# Patient Record
Sex: Male | Born: 1955 | Race: White | Hispanic: No | Smoking: Never smoker
Health system: Southern US, Community
[De-identification: ages and names within clinical notes are randomized; demographics above are authoritative.]

## PROBLEM LIST (undated history)

## (undated) DIAGNOSIS — I350 Nonrheumatic aortic (valve) stenosis: Secondary | ICD-10-CM

## (undated) DIAGNOSIS — I10 Essential (primary) hypertension: Secondary | ICD-10-CM

## (undated) DIAGNOSIS — I341 Nonrheumatic mitral (valve) prolapse: Principal | ICD-10-CM

## (undated) HISTORY — DX: Nonrheumatic mitral (valve) prolapse: I34.1

## (undated) HISTORY — DX: Nonrheumatic aortic (valve) stenosis: I35.0

## (undated) HISTORY — DX: Essential (primary) hypertension: I10

---

## 1976-08-20 HISTORY — PX: ORIF HUMERUS FRACTURE: SHX2126

## 1994-08-20 HISTORY — PX: ARTHROSCOPIC REPAIR ACL: SUR80

## 2000-02-01 ENCOUNTER — Emergency Department (HOSPITAL_COMMUNITY): Admission: EM | Admit: 2000-02-01 | Discharge: 2000-02-01 | Payer: Self-pay | Admitting: Emergency Medicine

## 2000-02-01 ENCOUNTER — Encounter: Payer: Self-pay | Admitting: Emergency Medicine

## 2000-02-01 ENCOUNTER — Encounter: Payer: Self-pay | Admitting: General Surgery

## 2003-05-27 ENCOUNTER — Encounter: Payer: Self-pay | Admitting: Gastroenterology

## 2003-05-27 ENCOUNTER — Ambulatory Visit (HOSPITAL_COMMUNITY): Admission: RE | Admit: 2003-05-27 | Discharge: 2003-05-27 | Payer: Self-pay | Admitting: Gastroenterology

## 2004-12-28 ENCOUNTER — Ambulatory Visit: Payer: Self-pay | Admitting: Family Medicine

## 2005-01-17 ENCOUNTER — Ambulatory Visit: Payer: Self-pay | Admitting: Family Medicine

## 2007-05-02 DIAGNOSIS — I10 Essential (primary) hypertension: Secondary | ICD-10-CM | POA: Insufficient documentation

## 2007-11-18 ENCOUNTER — Ambulatory Visit: Payer: Self-pay | Admitting: Family Medicine

## 2007-11-18 DIAGNOSIS — M5380 Other specified dorsopathies, site unspecified: Secondary | ICD-10-CM | POA: Insufficient documentation

## 2008-03-18 ENCOUNTER — Telehealth: Payer: Self-pay | Admitting: Family Medicine

## 2008-12-27 ENCOUNTER — Telehealth: Payer: Self-pay | Admitting: Family Medicine

## 2008-12-29 ENCOUNTER — Ambulatory Visit: Payer: Self-pay | Admitting: Family Medicine

## 2009-01-19 ENCOUNTER — Telehealth: Payer: Self-pay | Admitting: Family Medicine

## 2009-02-17 ENCOUNTER — Ambulatory Visit: Payer: Self-pay | Admitting: Family Medicine

## 2009-02-18 ENCOUNTER — Telehealth: Payer: Self-pay | Admitting: Family Medicine

## 2009-09-27 ENCOUNTER — Ambulatory Visit: Payer: Self-pay | Admitting: Family Medicine

## 2010-01-11 ENCOUNTER — Ambulatory Visit: Payer: Self-pay | Admitting: Family Medicine

## 2010-01-11 DIAGNOSIS — M549 Dorsalgia, unspecified: Secondary | ICD-10-CM | POA: Insufficient documentation

## 2010-01-11 DIAGNOSIS — H8309 Labyrinthitis, unspecified ear: Secondary | ICD-10-CM

## 2010-01-11 HISTORY — DX: Labyrinthitis, unspecified ear: H83.09

## 2010-08-07 ENCOUNTER — Telehealth: Payer: Self-pay | Admitting: Family Medicine

## 2010-08-17 ENCOUNTER — Ambulatory Visit: Payer: Self-pay | Admitting: Family Medicine

## 2010-08-17 DIAGNOSIS — F430 Acute stress reaction: Secondary | ICD-10-CM | POA: Insufficient documentation

## 2010-08-22 ENCOUNTER — Telehealth: Payer: Self-pay | Admitting: Family Medicine

## 2010-09-19 NOTE — Assessment & Plan Note (Signed)
Summary: spider bite/back pain//ccm   Vital Signs:  Patient profile:   55 year old male O2 Sat:      95 % Temp:     98.4 degrees F Pulse rate:   84 / minute BP sitting:   140 / 94  (left arm)  Vitals Entered By: Pura Spice, RN (Jan 11, 2010 2:35 PM) CC: killed  black spider  c/o rt eye puffy and ongoing back pain  he stated he takes 56 advil a day day    History of Present Illness: repeat BP 132/84 This itch 55-year-old white married male is in for evaluation and treatment of an insect bite on his face causing marked swelling and conjunctival erythema painful Hypertension has been controlled with Micardis 80-12,5 Continues to have problem with chronic back pain and takes Advil as needed. Diclofenac did not help. 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 Cleocin (Clindamycin Hcl)  Past History:  Risk Factors: Smoking Status: never (09/27/2009)  Past Medical History: Hypertension  chronic back pain  Review of Systems      See HPI  The patient denies anorexia, fever, weight loss, weight gain, vision loss, decreased hearing, hoarseness, chest pain, syncope, dyspnea on exertion, peripheral edema, prolonged cough, headaches, hemoptysis, abdominal pain, melena, hematochezia, severe indigestion/heartburn, hematuria, incontinence, genital sores, muscle weakness, suspicious skin lesions, transient blindness, difficulty walking, depression, unusual weight change, abnormal bleeding, enlarged lymph nodes, angioedema, breast masses, and testicular masses.    Physical Exam  General:  Well-developed,well-nourished,in no acute distress; alert,appropriate  and cooperative throughout examination Head:  Normocephalic and atraumatic without obvious abnormalities. No apparent alopecia or balding. Eyes:  periorbital region around right eye is swollen and erythematous and very tender to touch obvious site of insect bite Ears:  External ear exam shows no significant lesions or deformities.  Otoscopic examination reveals clear canals, tympanic membranes are intact bilaterally without bulging, retraction, inflammation or discharge. Hearing is grossly normal bilaterally. Nose:  External nasal examination shows no deformity or inflammation. Nasal mucosa are pink and moist without lesions or exudates. Mouth:  Oral mucosa and oropharynx without lesions or exudates.  Teeth in good repair. Neck:  No deformities, masses, or tenderness noted. Lungs:  Normal respiratory effort, chest expands symmetrically. Lungs are clear to auscultation, no crackles or wheezes. Heart:  Normal rate and regular rhythm. S1 and S2 normal without gallop, murmur, click, rub or other extra sounds. Msk:  muscle spasm L. lateral lumbar spine tenderness of lumbosacral spine   Impression & Recommendations:  Problem # 1:  LABYRINTHITIS, CHRONIC (ICD-386.30) Assessment New meclizine 25 mg t.i.d.  Problem # 2:  BACK PAIN, CHRONIC (ICD-724.5) Assessment: Unchanged  His updated medication list for this problem includes:    Advil 200 Mg Tabs (Ibuprofen) .Marland Kitchen... As needed    Aspirin 81 Mg Tabs (Aspirin) ..... Once daily    Flexeril 10 Mg Tabs (Cyclobenzaprine hcl) .Marland Kitchen... 1 morn midafternoon and bedtime for muscle spasm    Tylox 5-500 Mg Caps (Oxycodone-acetaminophen) .Marland Kitchen... 1 4-6 hh as needed pain, not over 4 per day    Diclofenac Sodium 75 Mg Tbec (Diclofenac sodium) .Marland Kitchen... 1  two times a day pc for inflammation  Tylox 5-500 Mg Caps (Oxycodone-acetaminophen) .Marland Kitchen... 1 q4-6 h as needed pain not over 4 per day, fill on june 25,2011    Tylox 5-500 Mg Caps (Oxycodone-acetaminophen) .Marland Kitchen... 1 q4h as  needed pain not  over 4 per day, fill july 25 the [place]  Problem # 3:  INSECT BITE (ICD-919.4) Assessment: New prednisone p.o. plus TobraDex to treat eye  Problem # 4:  CONJUNCTIVITIS, ACUTE (ICD-372.00) Assessment: New TobraDex t.i.d.  Problem # 5:  BACK PAIN, CHRONIC (ICD-724.5) Assessment: Deteriorated Depo-Medrol plus Flexeril plus Tylox for pain  Problem # 6:  HYPERTENSION (ICD-401.9) Assessment: Improved  His updated medication list for this problem includes:    Micardis Hct 80-12.5 Mg Tabs (Telmisartan-hctz) .Marland Kitchen... 1 once daily for blood pressure  Complete Medication List: 1)  Advil 200 Mg Tabs (Ibuprofen) .... As needed 2)  Micardis Hct 80-12.5 Mg Tabs (Telmisartan-hctz) .Marland Kitchen.. 1 once daily for blood pressure 3)  Aspirin 81 Mg Tabs (Aspirin) .... Once daily 4)  Flexeril 10 Mg Tabs (Cyclobenzaprine hcl) .Marland Kitchen.. 1 morn midafternoon and bedtime for muscle spasm 5)  Tylox 5-500 Mg Caps (Oxycodone-acetaminophen) .Marland Kitchen.. 1 4-6 hh as needed pain, not over 4 per day 6)  Diclofenac Sodium 75 Mg Tbec (Diclofenac sodium) .Marland Kitchen.. 1  two times a day pc for inflammation 7)  Tylox 5-500 Mg Caps (Oxycodone-acetaminophen) .Marland Kitchen.. 1 q4-6 h as needed pain not over 4 per day, fill on june 25,2011 8)  Tylox 5-500 Mg Caps (Oxycodone-acetaminophen) .Marland Kitchen.. 1 q4h as needed pain not  over 4 per day, fill july 25 9)  Meclizine Hcl 25 Mg Tabs (Meclizine hcl) .Marland Kitchen.. 1 morn, midaternoon and hs for dizziness and nausea 10)  Prednisone 20 Mg Tabs (Prednisone) .Marland Kitchen.. 1 qidpc for 2 days, 1tid for5 days then 1bid for 6 days then 1 once daily for spider bite and back inflamationm 11)  Tobradex St 0.3-0.05 % Susp (Tobramycin-dexamethasone) .Marland Kitchen.. 1 qtt in infected eye three times a day,also 1 qt at hs in not infected eye  Patient Instructions: 1)  Unable to determine type of insect bite on the face causing a reaction around and below the right eye as well as beginning conjunctivitis and was very  toxic to you 2)  Hypertension is controlled 3)  Will treat labyrinthitis with meclizine 4)  Continue to treat the chronic back pain with Advil 800 mg t.i.d. as well as Tylox for pain 5)  Call when you're coming back to town and we will schedule an MRI for your back Prescriptions: TOBRADEX ST 0.3-0.05 % SUSP (TOBRAMYCIN-DEXAMETHASONE) 1 qtt in infected eye three times a day,also 1 qt at hs in not infected eye  #5.0cc x 0   Entered and Authorized by:   Judithann Sheen MD   Signed by:   Judithann Sheen MD on 01/11/2010   Method used:   Print then Give to Patient   RxID:   [id number] PREDNISONE 20 MG TABS (PREDNISONE) 1 qidpc for 2 days, 1tid for5 days then 1bid for 6 days then 1 once daily for spider bite and back inflamationm  #44 x 0   Entered and Authorized by:   Judithann Sheen MD   Signed by:   Judithann Sheen MD on 01/11/2010   Method used:   Print then Give to Patient   RxID:   [id number] MECLIZINE HCL 25 MG TABS (MECLIZINE HCL) 1 morn, midaternoon and hs for dizziness and nausea  #90 x  5   Entered and Authorized by:   Judithann Sheen MD   Signed by:   Judithann Sheen MD on 01/11/2010   Method used:   Print then Give to Patient   RxID:   [id number] TYLOX 5-500 MG CAPS (OXYCODONE-ACETAMINOPHEN) 1 q4h as needed pain not  over 4 per day, fill July 25  #100 x 0   Entered and Authorized by:   Judithann Sheen MD   Signed by:   Judithann Sheen MD on 01/11/2010   Method used:   Print then Give to Patient   RxID:   [id number] TYLOX 5-500 MG CAPS (OXYCODONE-ACETAMINOPHEN) 1 q4-6 h as needed pain not over 4 per day, fill on June 25,2011  #100 x 0   Entered and Authorized by:   Judithann Sheen MD   Signed by:   Judithann Sheen MD on 01/11/2010   Method used:   Print then Give to Patient   RxID:   [id number] TYLOX 5-500 MG CAPS (OXYCODONE-ACETAMINOPHEN) 1 4-6 hh as needed pain, not over 4 per day  #100 x 0    Entered and Authorized by:   Judithann Sheen MD   Signed by:   Judithann Sheen MD on 01/11/2010   Method used:   Print then Give to Patient   RxID:   [id number]
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
## 2010-09-21 NOTE — Assessment & Plan Note (Signed)
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 This decision has caused considerable stress between he and his wife believes had considerable anxiety and some depression and is seeking some help her we discussed the pros and cons of this to see him Patient's blood pressure been under good control 120/86 her He continues to have back pain which is of a chronic nature and 8 take ibuprofen daily rather than diclofenac occasionally has to have Flexeril for muscle. He does need oxycodone at times when his pain is so bad He is in for refill of medications as well as a medication for anxiety depression and stress.  Allergies: 1)  ! Cleocin (Clindamycin Hcl)  Past History:  Past Medical History: Last updated: 01/11/2010 Hypertension  chronic back pain  Risk Factors: Smoking Status: never (09/27/2009)  Review of Systems      See HPI  Physical Exam  General:  Well-developed,well-nourished,in no acute distress; alert,appropriate and cooperative throughout examination Neck:  No deformities, masses, or tenderness noted. Chest Wall:  No deformities, masses, tenderness or gynecomastia noted. Lungs:  Normal respiratory effort, chest expands symmetrically. Lungs are clear to auscultation, no crackles or wheezes. Heart:  Normal rate and regular rhythm. S1 and S2 normal without gallop, murmur, click, rub or other extra sounds. Msk:   no positive findings on back exam except for mild lumbosacral muscles Pulses:  R and L carotid,radial,femoral,dorsalis pedis and posterior tibial pulses are full and equal bilaterally Extremities:  No clubbing, cyanosis, edema, or deformity noted with normal full range of motion of all joints.   Psych:  appearance somewhat anxious however he is  always hyper   Impression & Recommendations:  Problem # 1:  STRESS REACTION, ACUTE (ICD-308.9) Assessment New Lexapro 10 mg q.d.  Problem # 2:  LABYRINTHITIS, CHRONIC (ICD-386.30) Assessment: Improved hazy use meclizine 25 mg t.i.d. in the past  Problem # 3:  MUSCLE SPASM, LUMBAR REGION (ICD-724.8) Assessment: Improved  The following medications were removed from the medication list:    Diclofenac Sodium 75 Mg Tbec (Diclofenac sodium) .Marland Kitchen... 1  two times a day pc for inflammation His updated medication list for this problem includes:    Advil 200 Mg Tabs (Ibuprofen) .Marland Kitchen... As needed    Aspirin 81 Mg Tabs (Aspirin) ..... Once daily    Flexeril 10 Mg Tabs (Cyclobenzaprine hcl) .Marland Kitchen... 1 morn midafternoon and bedtime for muscle spasm    Tylox 5-500 Mg Caps (Oxycodone-acetaminophen) .Marland Kitchen... 1 4-6 hh as needed pain, not over 4 per day    Tylox 5-500 Mg Caps (Oxycodone-acetaminophen) .Marland Kitchen... 1 q4-6 h as needed pain not over 4 per day, fill on jan 29    Tylox 5-500 Mg Caps (Oxycodone-acetaminophen) .Marland Kitchen... 1 q4h as needed pain not  over 4 per  day, fill july 25  Problem # 4:  BACK PAIN, CHRONIC (ICD-724.5) Assessment: Improved  The following medications were removed from the medication list:    Diclofenac Sodium 75 Mg Tbec (Diclofenac sodium) .Marland Kitchen... 1  two times a day pc for inflammation His updated medication list for this problem includes:    Advil 200 Mg Tabs (Ibuprofen) .Marland Kitchen... As needed    Aspirin 81 Mg Tabs (Aspirin) ..... Once daily    Flexeril 10 Mg Tabs (Cyclobenzaprine hcl) .Marland Kitchen... 1 morn midafternoon and bedtime for muscle spasm    Tylox 5-500 Mg  Caps (Oxycodone-acetaminophen) .Marland Kitchen... 1 4-6 hh as needed pain, not over 4 per day    Tylox 5-500 Mg Caps (Oxycodone-acetaminophen) .Marland Kitchen... 1 q4-6 h as needed pain not over 4 per day, fill on jan 29    Tylox 5-500 Mg Caps (Oxycodone-acetaminophen) .Marland Kitchen... 1 q4h as needed pain not  over 4 per day, fill july 25  Problem # 5:  HYPERTENSION (ICD-401.9) Assessment: Improved  His updated medication list for this problem includes:    Micardis Hct 80-12.5 Mg Tabs (Telmisartan-hctz) .Marland Kitchen... 1 once daily for blood pressure  Complete Medication List: 1)  Advil 200 Mg Tabs (Ibuprofen) .... As needed 2)  Micardis Hct 80-12.5 Mg Tabs (Telmisartan-hctz) .Marland Kitchen.. 1 once daily for blood pressure 3)  Aspirin 81 Mg Tabs (Aspirin) .... Once daily 4)  Flexeril 10 Mg Tabs (Cyclobenzaprine hcl) .Marland Kitchen.. 1 morn midafternoon and bedtime for muscle spasm 5)  Tylox 5-500 Mg Caps (Oxycodone-acetaminophen) .Marland Kitchen.. 1 4-6 hh as needed pain, not over 4 per day 6)  Tylox 5-500 Mg Caps (Oxycodone-acetaminophen) .Marland Kitchen.. 1 q4-6 h as needed pain not over 4 per day, fill on jan 29 7)  Tylox 5-500 Mg Caps (Oxycodone-acetaminophen) .Marland Kitchen.. 1 q4h as needed pain not  over 4 per day, fill july 25 8)  Celexa 20 Mg Tabs (Citalopram hydrobromide) .Marland Kitchen.. 1 by mouth once daily  Patient Instructions: 1)  To continue medication as in the past for your hypertension and chronic low back pain. For this knee the anxiety or stress reaction and will prescribe Lexapro 10 mg q.d. 2)  Return in 4-6 weeks to evaluate result. Prescriptions: CELEXA 20 MG TABS (CITALOPRAM HYDROBROMIDE) 1 by mouth once daily  #30 x 6   Entered by:   Pura Spice, RN   Authorized by:   Judithann Sheen MD   Signed by:   Pura Spice, RN on 08/22/2010   Method used:   Electronically to        [facility] (retail)       [address]       [place], [place]  [address]       Ph: [phone]       Fax: [phone]   RxID:   [id number] TYLOX 5-500 MG  CAPS (OXYCODONE-ACETAMINOPHEN) 1 q4-6 h as needed pain not over 4 per day, fill on Jan 29  #120 x 0   Entered and Authorized by:   Judithann Sheen MD   Signed by:   Judithann Sheen MD on 08/17/2010   Method used:   Print then Give to Patient   RxID:   [id number] TYLOX 5-500 MG CAPS (OXYCODONE-ACETAMINOPHEN) 1 4-6 hh as needed pain, not over 4 per day  #120 x 0   Entered and Authorized by:   Judithann Sheen MD   Signed by:   Ivar Drape  Danice Goltz MD on 08/17/2010   Method used:   Print then Give to Patient   RxID:   [id number] MICARDIS HCT 80-12.5 MG TABS (TELMISARTAN-HCTZ) 1 once daily for blood pressure  #30 x 11   Entered and Authorized by:   Judithann Sheen MD   Signed by:   Judithann Sheen MD on 08/17/2010   Method used:   Electronically to        [facility] (retail)       [address]       [place], [place]  [address]       Ph: [phone]       Fax: [phone]   RxID:   [id number] LEXAPRO 10 MG TABS (ESCITALOPRAM OXALATE) 1 two times a day for anxiety and depression  #60 x 5   Entered and Authorized by:   Judithann Sheen MD   Signed by:   Judithann Sheen MD on 08/17/2010   Method used:   Electronically to        [facility] (retail)       [address]       [place], [place]  [address]       Ph: [phone]       Fax: [phone]   RxID:   [id number]    Orders Added: 1)  Est. Patient Level IV [[address]]

## 2010-09-21 NOTE — Progress Notes (Signed)
Summary: Pt needs to get ov with Dr Scotty Court re: med to calm pt down  Phone Note Call from Patient Call back at [phone] cell   Caller: spouse-Beth Summary of Call: Pt is needing to get ov with Dr. Scotty Court to discuss med to help calm him down. Pt gets upset a lot lately.  Initial call taken by: Lucy Antigua,  August 07, 2010 11:17 AM  Follow-up for Phone Call        Pts wife called to ck on status of phone call.Marland KitchenMarland KitchenMarland KitchenMarland Kitchen would like a return call to  [phone].  Follow-up by: Debbra Riding,  August 08, 2010 11:10 AM  Additional Follow-up for Phone Call Additional follow up Details #1::        work in next week Additional Follow-up by: Alfred Levins, CMA,  August 08, 2010 3:53 PM    Additional Follow-up for Phone Call Additional follow up Details #2::    LMTCB so appt can be scheduled / worked in.  Follow-up by: Debbra Riding,  August 08, 2010 4:03 PM   Appended Document: Pt needs to get ov with Dr Scotty Court re: med to calm pt down Pts wife called back - appt scheduled / worked in for  Thursday, 08/17/10  at  3pm.

## 2010-09-21 NOTE — Progress Notes (Signed)
Summary: change med to celexa   Phone Note Call from Patient Call back at [phone]   Caller: Patient Call For: stafford Summary of Call: pt was rx'd lexapro and it cost $250.  Wants to know if he could take Celexa or Paxil Erick Alley Initial call taken by: Alfred Levins, CMA,  August 22, 2010 12:50 PM  Follow-up for Phone Call        per dr Scotty Court change to gen celexa 20mg  once daily  Follow-up by: Pura Spice, RN,  August 22, 2010 3:31 PM  Additional Follow-up for Phone Call Additional follow up Details #1::        pt aware  Additional Follow-up by: Pura Spice, RN,  August 22, 2010 3:32 PM

## 2010-10-24 ENCOUNTER — Telehealth: Payer: Self-pay | Admitting: Family Medicine

## 2010-10-24 NOTE — Telephone Encounter (Signed)
To get appt with Dr. Rodena Medin

## 2010-10-24 NOTE — Telephone Encounter (Signed)
Pts wife called to ck and see if her husband can be worked into schedule for cpx for [employer].... Pt needs to have this done by the end of this week.... Adv that they will have cpx with another physician if needed..... Can pt be worked into schedule for same?  Pt can be reached at [phone].

## 2010-10-27 ENCOUNTER — Ambulatory Visit (INDEPENDENT_AMBULATORY_CARE_PROVIDER_SITE_OTHER): Payer: Self-pay | Admitting: Internal Medicine

## 2010-10-27 ENCOUNTER — Encounter: Payer: Self-pay | Admitting: Internal Medicine

## 2010-10-27 VITALS — BP 116/80 | HR 83 | Temp 97.9°F | Resp 12 | Ht 69.5 in | Wt 198.0 lb

## 2010-10-27 DIAGNOSIS — Z0289 Encounter for other administrative examinations: Secondary | ICD-10-CM

## 2010-10-27 DIAGNOSIS — Z024 Encounter for examination for driving license: Secondary | ICD-10-CM

## 2010-10-27 LAB — POCT URINALYSIS DIPSTICK
Ketones, UA: NEGATIVE
Nitrite, UA: NEGATIVE
Protein, UA: NEGATIVE
Spec Grav, UA: 1.015
Urobilinogen, UA: 1
pH, UA: 6.5

## 2010-10-28 ENCOUNTER — Encounter: Payer: Self-pay | Admitting: Internal Medicine

## 2010-10-28 DIAGNOSIS — Z024 Encounter for examination for driving license: Secondary | ICD-10-CM | POA: Insufficient documentation

## 2010-10-28 NOTE — Progress Notes (Signed)
  Subjective:    Patient ID: COSTA JHA, male    DOB: 10/12/1955, 55 y.o.   MRN: [id number]  HPI  Pt presents to clinic for CDL physical. Chart review performed and no apparent contraindication for driving noted. Denies h/o IDDM, sz hx, syncope, CP or altered MS. H/o HTN currently well controlled.     Review of Systems  Constitutional: Negative for fever, chills and fatigue.  HENT: Negative for hearing loss and congestion.   Eyes: Negative for photophobia, discharge and visual disturbance.  Respiratory: Negative for cough and shortness of breath.   Cardiovascular: Negative for chest pain and palpitations.  Musculoskeletal: Positive for back pain.  Neurological: Negative for dizziness, tremors, seizures, syncope, speech difficulty, weakness, numbness and headaches.  Psychiatric/Behavioral: Negative for behavioral problems, confusion and agitation.       Objective:   Physical Exam  Nursing note and vitals reviewed. Constitutional: He appears well-developed and well-nourished. No distress.  HENT:  Head: Normocephalic and atraumatic.  Right Ear: Tympanic membrane, external ear and ear canal normal. No decreased hearing is noted.  Left Ear: Tympanic membrane, external ear and ear canal normal. No decreased hearing is noted.  Nose: Nose normal.  Mouth/Throat: Oropharynx is clear and moist. No oropharyngeal exudate.  Eyes: Conjunctivae and EOM are normal. Pupils are equal, round, and reactive to light. Right eye exhibits no discharge. Left eye exhibits no discharge. No scleral icterus.  Neck: Normal range of motion. Neck supple. No JVD present.  Cardiovascular: Normal rate, regular rhythm and normal heart sounds.  Exam reveals no gallop and no friction rub.   No murmur heard. Pulmonary/Chest: Effort normal and breath sounds normal. No respiratory distress. He has no wheezes. He has no rales.  Abdominal: Soft. Bowel sounds are normal. He exhibits no distension and no mass. There is no  tenderness. There is no rebound and no guarding. Hernia confirmed negative in the right inguinal area and confirmed negative in the left inguinal area.  Musculoskeletal: Normal range of motion.  Lymphadenopathy:    He has no cervical adenopathy.       Right: No inguinal adenopathy present.       Left: No inguinal adenopathy present.  Neurological: He is alert. He has normal strength. No cranial nerve deficit.  Skin: Skin is warm and dry. He is not diaphoretic.  Psychiatric: He has a normal mood and affect. His behavior is normal. Judgment and thought content normal.      Eye exam: uncorrected Bilateral: 20/20  Left:20/30  Right:20/25                     Corrected  Bilateral:20/15  Left:20/15  Right:20/13  Hearing: finger rub noted at six feet bilaterally    Assessment & Plan:

## 2010-10-28 NOTE — Assessment & Plan Note (Signed)
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 UA nl. Paperwork completed and returned to pt.

## 2011-10-09 ENCOUNTER — Telehealth: Payer: Self-pay | Admitting: Family Medicine

## 2011-10-09 NOTE — Telephone Encounter (Signed)
Patient spouse calling stating that patient needs a refill on his mycardis sent to [facility]  On [address]. Please assist and inform patients spouse when available.

## 2011-10-10 MED ORDER — TELMISARTAN-HCTZ 80-12.5 MG PO TABS: 1.0000 | ORAL_TABLET | Freq: Every day | ORAL | Status: DC

## 2011-10-10 NOTE — Telephone Encounter (Signed)
Rx called in to pharmacy.  Spoke with pt's wife and she is aware rx sent to pharmacy.  Pt's wife given information for Dr. Rodena Medin .

## 2011-10-10 NOTE — Telephone Encounter (Signed)
Refill for one month. Needs office followup 

## 2011-10-10 NOTE — Telephone Encounter (Signed)
Pls advise.  

## 2012-08-15 ENCOUNTER — Encounter: Payer: Self-pay | Admitting: Family

## 2012-08-15 ENCOUNTER — Ambulatory Visit (INDEPENDENT_AMBULATORY_CARE_PROVIDER_SITE_OTHER): Payer: Self-pay | Admitting: Family

## 2012-08-15 VITALS — BP 120/90 | HR 69 | Temp 97.6°F | Resp 16 | Wt 189.8 lb

## 2012-08-15 DIAGNOSIS — I10 Essential (primary) hypertension: Secondary | ICD-10-CM

## 2012-08-15 DIAGNOSIS — F411 Generalized anxiety disorder: Secondary | ICD-10-CM

## 2012-08-15 DIAGNOSIS — F419 Anxiety disorder, unspecified: Secondary | ICD-10-CM | POA: Insufficient documentation

## 2012-08-15 LAB — BASIC METABOLIC PANEL
CO2: 31 mEq/L (ref 19–32)
Calcium: 10 mg/dL (ref 8.4–10.5)
Creat: 1.11 mg/dL (ref 0.50–1.35)
Glucose, Bld: 79 mg/dL (ref 70–99)
Sodium: 138 mEq/L (ref 135–145)

## 2012-08-15 MED ORDER — TELMISARTAN-HCTZ 80-12.5 MG PO TABS: 1.0000 | ORAL_TABLET | Freq: Every day | ORAL | Status: DC

## 2012-08-15 MED ORDER — SERTRALINE HCL 50 MG PO TABS: 50.0000 mg | ORAL_TABLET | Freq: Every day | ORAL | Status: DC

## 2012-08-15 NOTE — Assessment & Plan Note (Signed)
BP looks ok today, continue current dose of micardis HCT, obtain bmet.

## 2012-08-15 NOTE — Assessment & Plan Note (Signed)
Sertraline-  I instructed pt to start 1/2 tablet once daily for 1 week and then increase to a full tablet once daily on week two as tolerated.  We discussed common side effects such as nausea, drowsiness and weight gain.  Also discussed rare but serious side effect of suicide ideation.  She is instructed to discontinue medication go directly to ED if this occurs.  Pt verbalizes understanding.  Plan follow up in 1 month to evaluate progress.   25 minutes spent with pt today.  >50% of this time was spent counseling pt on his anxiety and HTN.

## 2012-08-15 NOTE — Patient Instructions (Addendum)
Complete your lab work prior to leaving.  Start sertraline 1/2 tab once daily for 1 week, then increase to a full tab on week two. Please schedule a follow up appointment in 1 month.

## 2012-08-15 NOTE — Progress Notes (Signed)
  Subjective:    Patient ID: Robert Hobbs, male    DOB: 12-15-1955, 56 y.o.   MRN: [id number]  HPI  Robert Hobbs is a 56 yr old male who presents today for follow up.  1) HTN- Pt reports that he has had some readings with elevated DBP >100.  He is requesting refills on his micardis HCT.  2)Anxiety/Stress- Reports that he has had increased anxiety for > 1 yr. 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  He is often away from home for 4 months at a time.  Reports mother has similar issues with anxiety and ultimately was placed on xanax.  Reports that he will often get angry and do thinks like, "hit the computer in his truck." finds that he loses his temper easily with his children and doesn't like that.  He denies symptoms of depression.  Occasional panic attacks.     Review of Systems  See HPI  Past Medical History  Diagnosis Date  . Hypertension     History   Social History  . Marital Status: Married    Spouse Name: N/A    Number of Children: N/A  . Years of Education: N/A   Occupational History  . Not on file.   Social History Main Topics  . Smoking status: Never Smoker   . Smokeless tobacco: Not on file  . Alcohol Use: Not on file  . Drug Use: Not on file  . Sexually Active: Not on file   Other Topics Concern  . Not on file   Social History Narrative  . No narrative on file    No past surgical history on file.  No family history on file.  Allergies  Allergen Reactions  . Cleocin (Clindamycin Hcl) Nausea And Vomiting  . Shellfish Allergy     Current Outpatient Prescriptions on File Prior to Visit  Medication Sig Dispense Refill  . aspirin 81 MG tablet Take 81 mg by mouth daily.        . cyclobenzaprine (FLEXERIL) 10 MG tablet Take 10 mg by mouth 3 (three) times daily as needed.        Marland Kitchen ibuprofen (ADVIL,MOTRIN) 200 MG tablet Take 200 mg by mouth every 6 (six) hours as needed.        Marland Kitchen  oxyCODONE-acetaminophen (TYLOX) 5-500 MG per capsule Take 1 capsule by mouth every 4 (four) hours as needed. Fill on schedule       . telmisartan-hydrochlorothiazide (MICARDIS HCT) 80-12.5 MG per tablet Take 1 tablet by mouth daily.  30 tablet  2  . sertraline (ZOLOFT) 50 MG tablet Take 1 tablet (50 mg total) by mouth daily.  30 tablet  1    BP 120/90  Pulse 69  Temp 97.6 F (36.4 C) (Oral)  Resp 16  Wt 189 lb 12 oz (86.07 kg)  SpO2 99%        Objective:   Physical Exam  Constitutional: He appears well-developed and well-nourished. No distress.  Psychiatric: He has a normal mood and affect. His behavior is normal. Judgment and thought content normal.          Assessment & Plan:

## 2012-08-18 ENCOUNTER — Ambulatory Visit: Payer: Self-pay | Admitting: Internal Medicine

## 2012-08-25 ENCOUNTER — Encounter: Payer: Self-pay | Admitting: Family

## 2012-11-15 ENCOUNTER — Other Ambulatory Visit: Payer: Self-pay | Admitting: Family

## 2012-11-17 NOTE — Telephone Encounter (Signed)
Sertraline request [Last Rx 12.27.13, began at [facility]]; pt due back for 1-mth follow-up/SLS Please advise.

## 2012-11-18 NOTE — Telephone Encounter (Signed)
Can send 7 day supply. Due for office visit.

## 2012-11-19 NOTE — Telephone Encounter (Signed)
Can give #14 tabs, no refills. Due for follow up.

## 2012-11-19 NOTE — Telephone Encounter (Signed)
Rx request to pharmacy #[id number]; *Office Visit Needed Prior to Future Refills*/SLS

## 2012-11-19 NOTE — Telephone Encounter (Signed)
Zoloft request [Last Rx 12.27.13 #30x1]/SLS Please advise.

## 2012-11-28 ENCOUNTER — Telehealth: Payer: Self-pay | Admitting: Internal Medicine

## 2012-11-28 NOTE — Telephone Encounter (Signed)
He was due for follow up in January. Needs to be re-evaluated. Can send 7 day supply.  No further refills until seen back in the office.

## 2012-11-28 NOTE — Telephone Encounter (Signed)
Notified pt's wife re: instructions below. She states it is very difficult for pt to get an appt has he is only at home on the weekends. Advised her we previously gave a 2 week supply on 11/15/12 with notice that he would need to be seen before further refills could be given. Explained need for close follow up as he has not been seen since starting this medication. She states they never picked up rx from 11/15/12. Advised her to contact pharmacy for Rx on file and additional 7 day supply will not be called in. She voices understanding but does not schedule appt at this time.

## 2012-11-28 NOTE — Telephone Encounter (Signed)
Patients wife beth called in stating that patient is out of zoloft and would like another refill sent to the pharmacy. 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

## 2012-11-28 NOTE — Telephone Encounter (Signed)
Please advise re: request below.

## 2012-12-15 ENCOUNTER — Ambulatory Visit: Payer: Self-pay | Admitting: Family

## 2013-01-02 ENCOUNTER — Ambulatory Visit (HOSPITAL_BASED_OUTPATIENT_CLINIC_OR_DEPARTMENT_OTHER)
Admission: RE | Admit: 2013-01-02 | Discharge: 2013-01-02 | Disposition: A | Discharge status: Home / Self Care | Payer: Self-pay | Source: Ambulatory Visit | Attending: Family | Admitting: Family

## 2013-01-02 ENCOUNTER — Ambulatory Visit (INDEPENDENT_AMBULATORY_CARE_PROVIDER_SITE_OTHER): Payer: Self-pay | Admitting: Family

## 2013-01-02 ENCOUNTER — Encounter: Payer: Self-pay | Admitting: Family

## 2013-01-02 VITALS — BP 134/90 | HR 100 | Temp 97.9°F | Resp 16 | Wt 191.0 lb

## 2013-01-02 DIAGNOSIS — M545 Low back pain, unspecified: Secondary | ICD-10-CM | POA: Insufficient documentation

## 2013-01-02 DIAGNOSIS — F419 Anxiety disorder, unspecified: Secondary | ICD-10-CM

## 2013-01-02 DIAGNOSIS — F411 Generalized anxiety disorder: Secondary | ICD-10-CM

## 2013-01-02 DIAGNOSIS — I10 Essential (primary) hypertension: Secondary | ICD-10-CM

## 2013-01-02 DIAGNOSIS — M549 Dorsalgia, unspecified: Secondary | ICD-10-CM

## 2013-01-02 LAB — BASIC METABOLIC PANEL
Calcium: 10 mg/dL (ref 8.4–10.5)
Creat: 1.26 mg/dL (ref 0.50–1.35)
Sodium: 141 mEq/L (ref 135–145)

## 2013-01-02 MED ORDER — PREDNISONE 10 MG PO TABS: ORAL_TABLET | ORAL | Status: DC

## 2013-01-02 MED ORDER — SERTRALINE HCL 50 MG PO TABS: ORAL_TABLET | ORAL | Status: DC

## 2013-01-02 MED ORDER — TELMISARTAN-HCTZ 80-12.5 MG PO TABS: 1.0000 | ORAL_TABLET | Freq: Every day | ORAL | Status: DC

## 2013-01-02 NOTE — Progress Notes (Signed)
  Subjective:    Patient ID: Robert Hobbs, male    DOB: 1956/04/09, 57 y.o.   MRN: [id number]  HPI  Robert Hobbs is a 57 yr old male who presents today for follow up.  1) HTN- the pt is currently maintained on micardis-hct. He has been off of his BP med x 10 days.    2) Anxiety- last visit the pt was started on sertraline 50mg .  When he started on the 25mg - initially had dizziness. Advanced to 50mg . Felt improvement in his anger.    3) Back pain- Reports that he had a low back jury in the early 1990's. He saw Dr. Scotty Court back then and he was given Tylox. pt reports flare up of his back pain. Reports that if turns a certain way or sleep a certain way he has low back pain. 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 He has been taking otc tylenol or BC's, ibuprofen.  No significant improvement.  Pain is non-radiating.      Review of Systems See HPI  Past Medical History  Diagnosis Date  . Hypertension     History   Social History  . Marital Status: Married    Spouse Name: N/A    Number of Children: N/A  . Years of Education: N/A   Occupational History  . Not on file.   Social History Main Topics  . Smoking status: Never Smoker   . Smokeless tobacco: Not on file  . Alcohol Use: Not on file  . Drug Use: Not on file  . Sexually Active: Not on file   Other Topics Concern  . Not on file   Social History Narrative  . No narrative on file    No past surgical history on file.  No family history on file.  Allergies  Allergen Reactions  . Cleocin (Clindamycin Hcl) Nausea And Vomiting  . Shellfish Allergy     Current Outpatient Prescriptions on File Prior to Visit  Medication Sig Dispense Refill  . ibuprofen (ADVIL,MOTRIN) 200 MG tablet Take 200 mg by mouth every 6 (six) hours as needed.        Marland Kitchen oxyCODONE-acetaminophen (TYLOX) 5-500 MG per capsule Take 1 capsule by mouth every 4 (four) hours as needed. Fill on schedule        No current  facility-administered medications on file prior to visit.    BP 134/90  Pulse 100  Temp(Src) 97.9 F (36.6 C) (Oral)  Resp 16  Wt 191 lb (86.637 kg)  BMI 27.81 kg/m2  SpO2 97%       Objective:   Physical Exam  Constitutional: He appears well-developed and well-nourished. No distress.  Cardiovascular: Normal rate and regular rhythm.   No murmur heard. Pulmonary/Chest: Effort normal and breath sounds normal. No respiratory distress. He has no wheezes. He has no rales. He exhibits no tenderness.  Musculoskeletal: He exhibits no edema.       Thoracic back: He exhibits no tenderness.       Lumbar back: He exhibits no tenderness.  Bilateral LE strenght is 5/5  Neurological: He is alert.  Psychiatric: He has a normal mood and affect. His behavior is normal. Judgment and thought content normal.          Assessment & Plan:

## 2013-01-02 NOTE — Patient Instructions (Signed)
Please complete lab work prior to leaving.   Complete x ray on the first floor.  Follow up in 3 months.  

## 2013-01-03 NOTE — Assessment & Plan Note (Signed)
Overall, pt felt much better on zoloft. Resume at 50 mg, then increase to 75 mg once daily as needed. O

## 2013-01-03 NOTE — Assessment & Plan Note (Signed)
Obtain x ray of lumbar spine.  Trial of medrol dose pak.

## 2013-01-03 NOTE — Assessment & Plan Note (Signed)
BPwas better on med.  I am concerned it will creep up if off of med.  Resume BP med.  Obtain bmet.

## 2013-01-05 ENCOUNTER — Encounter: Payer: Self-pay | Admitting: Family

## 2013-03-24 ENCOUNTER — Telehealth: Payer: Self-pay | Admitting: Internal Medicine

## 2013-03-24 NOTE — Telephone Encounter (Signed)
Form completed and faxed to [phone]. Notified pt.

## 2013-03-24 NOTE — Telephone Encounter (Signed)
Patients wife Waynetta Sandy called in requesting that we call her when we fax form regarding zoloft medication to patient. [phone]

## 2013-03-25 NOTE — Telephone Encounter (Signed)
Pt's wife called at 3:15 stating pt's employer did not receive the form. Form refaxed to # below. Received call from pt at 4:10pm stating they still had not received form. Obtained alternate fax# [phone] and faxed form at 4:20pm. Pt called back to say that they did receive it.

## 2013-05-22 ENCOUNTER — Telehealth: Payer: Self-pay | Admitting: *Deleted

## 2013-05-22 NOTE — Telephone Encounter (Signed)
Patient reports that he wants to reduce cost on BP medication and has [2] recommendations from pharmacist: (1) Valsartan-HCTZ 80-12.5 mg he was told would be the best & closest in comparison [to Micardis HCT 80-12.5] currently taking, (2) option would be Losartan-HCTZ 100-25mg /SLS Please Advise on appropriate recommendations.

## 2013-05-25 MED ORDER — VALSARTAN-HYDROCHLOROTHIAZIDE 80-12.5 MG PO TABS: 1.0000 | ORAL_TABLET | Freq: Every day | ORAL | Status: DC

## 2013-05-25 NOTE — Telephone Encounter (Signed)
Left message for pt to return my call.

## 2013-05-25 NOTE — Telephone Encounter (Signed)
Stop micardis. Start valsartan hctz 80-12.5  Follow up in 1 month for follow up BP and blood work.

## 2013-05-25 NOTE — Telephone Encounter (Signed)
Notified pt and he voices understanding. 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 States he will have his wife call to schedule his follow up.

## 2013-06-22 ENCOUNTER — Telehealth: Payer: Self-pay | Admitting: *Deleted

## 2013-06-22 MED ORDER — SERTRALINE HCL 50 MG PO TABS: ORAL_TABLET | ORAL | Status: DC

## 2013-06-22 MED ORDER — VALSARTAN-HYDROCHLOROTHIAZIDE 80-12.5 MG PO TABS: 1.0000 | ORAL_TABLET | Freq: Every day | ORAL | Status: DC

## 2013-06-22 NOTE — Telephone Encounter (Signed)
Spoke with pt. He has picked up Micardis HCT, 30 day supply. Will not get valsartan HCT until his next return home in 1 month. Rx has been resent. Pt states he will be home for 10 days in December and has scheduled follow up for 08/12/13 at 10:30am.  Pt also requests refill of zoloft. Refill sent.

## 2013-06-22 NOTE — Telephone Encounter (Signed)
Received message from pt that he went to pick up his valsartan hct rx and the pharmacy said they never received it. 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  Per review of EPIC, rx was sent to pharmacy on 05/25/13, #30 x 2 refills. Attempted to reach pt and left message to return my call. Called pharmacy, they state they did not receive previous rx and that pt filled his micardis rx today. Advised pharmacy we would not give new rx at this time until I can speak with pt.

## 2013-07-14 ENCOUNTER — Telehealth: Payer: Self-pay | Admitting: *Deleted

## 2013-07-14 NOTE — Telephone Encounter (Signed)
Received message from pt's wife requesting refills on the new BP med we were supposed to send to replace micardis hct and refill on zoloft. Advised her that per 06/22/13 phone note his pharmacy should have both prescriptions on file as I sent a 30 day supply x 1 refill of each medication. Advised her to call back tomorrow if any problems or concerns.

## 2013-08-11 ENCOUNTER — Telehealth: Payer: Self-pay | Admitting: Family

## 2013-08-11 NOTE — Telephone Encounter (Signed)
Patient cancelled his appt for 12-24 as he was called in to work.  He cannot reschedule till after the first of the year as Robert Hobbs is off the last week of the month.  He feels the new med is not keeping his bp down as low as the micardis did.  It has been running 140/86 or higher.  Patient is concerned  Please advise what to do

## 2013-08-12 ENCOUNTER — Ambulatory Visit: Payer: Self-pay | Admitting: Family

## 2013-08-12 MED ORDER — VALSARTAN-HYDROCHLOROTHIAZIDE 160-12.5 MG PO TABS: 1.0000 | ORAL_TABLET | Freq: Every day | ORAL | Status: DC

## 2013-08-12 MED ORDER — SERTRALINE HCL 50 MG PO TABS: ORAL_TABLET | ORAL | Status: DC

## 2013-08-12 NOTE — Telephone Encounter (Signed)
Notified pt's wife  

## 2013-08-12 NOTE — Telephone Encounter (Signed)
If he wants to pay for micardis I can change him back.  If not, I will increase diovan HCT.  Either way we should see him back in the office in January.  Let me know what he would like to do. thanks

## 2013-08-12 NOTE — Telephone Encounter (Signed)
Left detailed message on spouse's voicemail and to let us know how pt wants to proceed.

## 2013-08-12 NOTE — Telephone Encounter (Signed)
Rx sent for diovan 160/12.5 to replace the 80/12.5 dose.

## 2013-08-12 NOTE — Telephone Encounter (Signed)
Wife called back and stated if Efraim Kaufmann feels increasing the dose then please call in a script for the increased dosage

## 2013-09-09 ENCOUNTER — Other Ambulatory Visit: Payer: Self-pay | Admitting: Family

## 2013-09-09 NOTE — Telephone Encounter (Signed)
Received call from pt's wife. 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  Advised her we gave a 30 day supply in December with intent of seeing pt for follow up in January. She states it is difficult for pt to come in for appts due to his job and they have explained this to us before. I advised her that we understand but Provider is trying to give pt the best care. Pt's dose was also increased in December and he should be seen to see how adjustment is working and to monitor kidney functions. 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 Call was disconnected.  Please advise.

## 2013-09-10 NOTE — Telephone Encounter (Addendum)
I would like to see him in the office the week of 2/9.  30 day supply sent, no additional refills until seen as he will need blood work.

## 2013-09-11 NOTE — Telephone Encounter (Signed)
Please call and schedule an appt

## 2013-09-14 NOTE — Telephone Encounter (Signed)
Left message for patient to return my call.

## 2013-09-15 NOTE — Telephone Encounter (Signed)
Patient scheduled appointment for 09/28/13

## 2013-09-28 ENCOUNTER — Ambulatory Visit: Payer: Self-pay | Admitting: Family

## 2013-10-02 ENCOUNTER — Ambulatory Visit: Payer: Self-pay | Admitting: Family

## 2013-10-12 ENCOUNTER — Encounter: Payer: Self-pay | Admitting: Family

## 2013-10-12 ENCOUNTER — Ambulatory Visit (INDEPENDENT_AMBULATORY_CARE_PROVIDER_SITE_OTHER): Payer: Self-pay | Admitting: Family

## 2013-10-12 VITALS — BP 110/70 | HR 73 | Temp 97.7°F | Resp 16 | Wt 202.0 lb

## 2013-10-12 DIAGNOSIS — F419 Anxiety disorder, unspecified: Secondary | ICD-10-CM

## 2013-10-12 DIAGNOSIS — F411 Generalized anxiety disorder: Secondary | ICD-10-CM

## 2013-10-12 DIAGNOSIS — M549 Dorsalgia, unspecified: Secondary | ICD-10-CM

## 2013-10-12 DIAGNOSIS — I10 Essential (primary) hypertension: Secondary | ICD-10-CM

## 2013-10-12 LAB — BASIC METABOLIC PANEL
BUN: 14 mg/dL (ref 6–23)
CO2: 30 mEq/L (ref 19–32)
Calcium: 9.7 mg/dL (ref 8.4–10.5)
Chloride: 101 mEq/L (ref 96–112)
Creat: 1.14 mg/dL (ref 0.50–1.35)
GLUCOSE: 89 mg/dL (ref 70–99)
POTASSIUM: 4.5 meq/L (ref 3.5–5.3)
Sodium: 138 mEq/L (ref 135–145)

## 2013-10-12 MED ORDER — VALSARTAN-HYDROCHLOROTHIAZIDE 160-12.5 MG PO TABS: ORAL_TABLET | ORAL | Status: DC

## 2013-10-12 MED ORDER — SERTRALINE HCL 100 MG PO TABS
100.0000 mg | ORAL_TABLET | Freq: Every day | ORAL | Status: DC
Start: 2013-10-12 — End: 2014-02-09

## 2013-10-12 NOTE — Assessment & Plan Note (Signed)
Stable. Takes OTC ibuprofen as needed.

## 2013-10-12 NOTE — Patient Instructions (Addendum)
Obtain lab work prior to leaving, Increase Zoloft 100 daily for anxiety. Please schedule a fasting physical at the front desk for 3 months.

## 2013-10-12 NOTE — Assessment & Plan Note (Signed)
Stable. Continue Diovan at current dose. Will obtain BMET today. Follow up in three months.

## 2013-10-12 NOTE — Progress Notes (Signed)
Subjective:    Patient ID: Robert Hobbs, male    DOB: 01-13-1956, 58 y.o.   MRN: [id number]  Hypertension Associated symptoms include anxiety. Pertinent negatives include no chest pain, headaches, palpitations or shortness of breath.  Anxiety Patient reports no chest pain, dizziness, palpitations, shortness of breath or suicidal ideas.     Robert Hobbs is a 58 year old male who presents today for follow up.  Hypertension) Patient reports the increase in diovan is helping, denies chest pain and shortness of breath. Patient denies regular exercise and is working on his healthy diet.  BP Readings from Last 3 Encounters:  10/12/13 110/70  01/02/13 134/90  08/15/12 120/90    Anxiety) Patient does feel the Zoloft strength is helping with his anger although patient reports he occasionally starts to feel frustrated and angry in the late afternoon and believe his dose needs to be increased. Denies panic attacks.  Back Pain) Patient denies pain today, reports occasional pain in the morning upon waking. Patient taking ibuprofen once daily for pain at this point, reported that the prednisone helped with back pain in the past.    Review of Systems  Constitutional: Negative for fever.  HENT: Negative for rhinorrhea.   Respiratory: Negative for cough and shortness of breath.   Cardiovascular: Negative for chest pain, palpitations and leg swelling.  Musculoskeletal: Positive for back pain.       Chronic back pain.  Neurological: Negative for dizziness and headaches.  Psychiatric/Behavioral: Positive for agitation. Negative for suicidal ideas.   Past Medical History  Diagnosis Date  . Hypertension     History   Social History  . Marital Status: Married    Spouse Name: N/A    Number of Children: N/A  . Years of Education: N/A   Occupational History  . Not on file.   Social History Main Topics  . Smoking status: Never Smoker   . Smokeless tobacco: Not on file  . Alcohol Use: Not  on file  . Drug Use: Not on file  . Sexual Activity: Not on file   Other Topics Concern  . Not on file   Social History Narrative  . No narrative on file    No past surgical history on file.  No family history on file.  Allergies  Allergen Reactions  . Cleocin [Clindamycin Hcl] Nausea And Vomiting  . Shellfish Allergy     Current Outpatient Prescriptions on File Prior to Visit  Medication Sig Dispense Refill  . acetaminophen (TYLENOL) 500 MG tablet Take 500 mg by mouth every 6 (six) hours as needed for pain.      Marland Kitchen. ibuprofen (ADVIL,MOTRIN) 200 MG tablet Take 200 mg by mouth every 6 (six) hours as needed.         No current facility-administered medications on file prior to visit.    BP 110/70  Pulse 73  Temp(Src) 97.7 F (36.5 C) (Oral)  Resp 16  Wt 202 lb 0.6 oz (91.645 kg)  SpO2 97%       Objective:   Physical Exam  Constitutional: He is oriented to person, place, and time. He appears well-developed and well-nourished.  HENT:  Head: Normocephalic and atraumatic.  Cardiovascular: Normal rate, regular rhythm, S1 normal, S2 normal and normal heart sounds.   Pulses:      Radial pulses are 2+ on the right side, and 2+ on the left side.  Pulmonary/Chest: Effort normal and breath sounds normal. No respiratory distress.  Musculoskeletal: He exhibits no  edema.  Neurological: He is alert and oriented to person, place, and time.  Skin: Skin is warm and dry.  Psychiatric: He has a normal mood and affect. His behavior is normal. Thought content normal.          Assessment & Plan:  I have personally seen and examined patient and agree with Vernona Rieger NP-student's assessment and plan.

## 2013-10-12 NOTE — Progress Notes (Signed)
Pre visit review using our clinic review tool, if applicable. No additional management support is needed unless otherwise documented below in the visit note. 

## 2013-10-12 NOTE — Assessment & Plan Note (Addendum)
Irritable in the afternoons. Increase Zoloft to 100mg  daily. Follow up in three months.

## 2013-10-13 ENCOUNTER — Encounter: Payer: Self-pay | Admitting: Family

## 2013-10-13 ENCOUNTER — Telehealth: Payer: Self-pay | Admitting: Family

## 2013-10-13 NOTE — Telephone Encounter (Signed)
Relevant patient education mailed to patient.  

## 2014-02-09 ENCOUNTER — Telehealth: Payer: Self-pay | Admitting: Family

## 2014-02-09 ENCOUNTER — Other Ambulatory Visit: Payer: Self-pay | Admitting: *Deleted

## 2014-02-09 MED ORDER — VALSARTAN-HYDROCHLOROTHIAZIDE 160-12.5 MG PO TABS: ORAL_TABLET | ORAL | Status: DC

## 2014-02-09 MED ORDER — SERTRALINE HCL 100 MG PO TABS: 100.0000 mg | ORAL_TABLET | Freq: Every day | ORAL | Status: DC

## 2014-02-09 NOTE — Telephone Encounter (Signed)
Returned voicemail call back to patient regarding cough and congestion.  No Answer.  Left message to call.

## 2014-02-09 NOTE — Telephone Encounter (Signed)
Refill request for sertraline Last filled by MD on - 10/12/2013 #30 x3 Last Appt: 10/12/2013 Next Appt: none (pt's wife stated that she will make appt for pt) Please advise refill?

## 2014-02-10 NOTE — Telephone Encounter (Signed)
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

## 2014-02-10 NOTE — Telephone Encounter (Signed)
Pt reports that he has wheezing/cough/chest congestion/headache. Reports symptoms have been present x 2 months.  He denies known fever.  Cough is  mildly productive.  Advised pt to try adding claritin and PRN delsym.  If symptoms do not improve with these measures, then I advised pt to be seen at an Urgent care.  He verbalizes understanding.

## 2014-02-10 NOTE — Telephone Encounter (Signed)
Patient is on the road working. Patient stated that he is coughing and has chest congestion. Patient is unable to stop coughing and unable to rest. Patient is requesting a medication be called into his pharmacy. Please advise? Patient can be reached by his work number [phone].

## 2014-02-24 ENCOUNTER — Other Ambulatory Visit: Payer: Self-pay | Admitting: Family

## 2014-02-24 ENCOUNTER — Encounter: Payer: Self-pay | Admitting: Family

## 2014-02-24 ENCOUNTER — Ambulatory Visit (INDEPENDENT_AMBULATORY_CARE_PROVIDER_SITE_OTHER): Payer: Self-pay | Admitting: Family

## 2014-02-24 VITALS — BP 118/90 | HR 77 | Temp 97.8°F | Resp 16 | Wt 200.1 lb

## 2014-02-24 DIAGNOSIS — J45909 Unspecified asthma, uncomplicated: Secondary | ICD-10-CM | POA: Insufficient documentation

## 2014-02-24 DIAGNOSIS — I059 Rheumatic mitral valve disease, unspecified: Secondary | ICD-10-CM

## 2014-02-24 DIAGNOSIS — I341 Nonrheumatic mitral (valve) prolapse: Secondary | ICD-10-CM

## 2014-02-24 DIAGNOSIS — F419 Anxiety disorder, unspecified: Secondary | ICD-10-CM

## 2014-02-24 DIAGNOSIS — I10 Essential (primary) hypertension: Secondary | ICD-10-CM

## 2014-02-24 DIAGNOSIS — F411 Generalized anxiety disorder: Secondary | ICD-10-CM

## 2014-02-24 DIAGNOSIS — Z0000 Encounter for general adult medical examination without abnormal findings: Secondary | ICD-10-CM

## 2014-02-24 HISTORY — DX: Nonrheumatic mitral (valve) prolapse: I34.1

## 2014-02-24 LAB — BASIC METABOLIC PANEL WITH GFR
BUN: 13 mg/dL (ref 6–23)
CALCIUM: 9.8 mg/dL (ref 8.4–10.5)
CO2: 30 mEq/L (ref 19–32)
CREATININE: 1.2 mg/dL (ref 0.50–1.35)
Chloride: 96 mEq/L (ref 96–112)
GFR, EST AFRICAN AMERICAN: 77 mL/min
GFR, Est Non African American: 66 mL/min
GLUCOSE: 87 mg/dL (ref 70–99)
Potassium: 4.6 mEq/L (ref 3.5–5.3)
Sodium: 135 mEq/L (ref 135–145)

## 2014-02-24 LAB — LIPID PANEL
Cholesterol: 231 mg/dL — ABNORMAL HIGH (ref 0–200)
HDL: 52 mg/dL (ref >39)
LDL Cholesterol: 142 mg/dL — ABNORMAL HIGH (ref 0–99)
TRIGLYCERIDES: 184 mg/dL — AB (ref <150)
Total CHOL/HDL Ratio: 4.4 Ratio
VLDL: 37 mg/dL (ref 0–40)

## 2014-02-24 LAB — CBC WITH DIFFERENTIAL/PLATELET
BASOS ABS: 0.1 10*3/uL (ref 0.0–0.1)
BASOS PCT: 2 % — AB (ref 0–1)
EOS ABS: 0.2 10*3/uL (ref 0.0–0.7)
Eosinophils Relative: 5 % (ref 0–5)
HCT: 43.8 % (ref 39.0–52.0)
Hemoglobin: 15.7 g/dL (ref 13.0–17.0)
Lymphocytes Relative: 30 % (ref 12–46)
Lymphs Abs: 1.5 10*3/uL (ref 0.7–4.0)
MCH: 31.4 pg (ref 26.0–34.0)
MCHC: 35.8 g/dL (ref 30.0–36.0)
MCV: 87.6 fL (ref 78.0–100.0)
Monocytes Absolute: 0.3 10*3/uL (ref 0.1–1.0)
Monocytes Relative: 7 % (ref 3–12)
NEUTROS PCT: 56 % (ref 43–77)
Neutro Abs: 2.7 10*3/uL (ref 1.7–7.7)
PLATELETS: 224 10*3/uL (ref 150–400)
RBC: 5 MIL/uL (ref 4.22–5.81)
RDW: 13.1 % (ref 11.5–15.5)
WBC: 4.9 10*3/uL (ref 4.0–10.5)

## 2014-02-24 LAB — HEPATIC FUNCTION PANEL
ALBUMIN: 4.5 g/dL (ref 3.5–5.2)
ALT: 13 U/L (ref 0–53)
AST: 18 U/L (ref 0–37)
Alkaline Phosphatase: 48 U/L (ref 39–117)
BILIRUBIN TOTAL: 0.5 mg/dL (ref 0.2–1.2)
Bilirubin, Direct: 0.1 mg/dL (ref 0.0–0.3)
Indirect Bilirubin: 0.4 mg/dL (ref 0.2–1.2)
Total Protein: 7.3 g/dL (ref 6.0–8.3)

## 2014-02-24 LAB — TSH: TSH: 6.376 u[IU]/mL — ABNORMAL HIGH (ref 0.350–4.500)

## 2014-02-24 MED ORDER — MONTELUKAST SODIUM 10 MG PO TABS: 10.0000 mg | ORAL_TABLET | Freq: Every day | ORAL | Status: DC

## 2014-02-24 MED ORDER — VALSARTAN-HYDROCHLOROTHIAZIDE 160-12.5 MG PO TABS: ORAL_TABLET | ORAL | Status: DC

## 2014-02-24 MED ORDER — ALBUTEROL SULFATE HFA 108 (90 BASE) MCG/ACT IN AERS: 2.0000 | INHALATION_SPRAY | Freq: Four times a day (QID) | RESPIRATORY_TRACT | Status: DC | PRN

## 2014-02-24 MED ORDER — SERTRALINE HCL 100 MG PO TABS: 100.0000 mg | ORAL_TABLET | Freq: Every day | ORAL | Status: DC

## 2014-02-24 NOTE — Patient Instructions (Signed)
Please complete lab work prior to leaving. Schedule physical at the front desk. We will plan to see you back in 6 months for follow up of your depression/anxiety.

## 2014-02-24 NOTE — Assessment & Plan Note (Signed)
BP stable.  Continue diovan hct.  Obtain bmet.

## 2014-02-24 NOTE — Progress Notes (Signed)
Pre visit review using our clinic review tool, if applicable. No additional management support is needed unless otherwise documented below in the visit note. 

## 2014-02-24 NOTE — Assessment & Plan Note (Signed)
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

## 2014-02-24 NOTE — Assessment & Plan Note (Signed)
Cough/wheezing most consistent with asthma. Due to nasal drainage will initiate singulair to help with both allergic rhinitis and asthma symptoms. Add prn albuterol.

## 2014-02-24 NOTE — Assessment & Plan Note (Signed)
Stable on zoloft, continue same.  

## 2014-02-24 NOTE — Progress Notes (Signed)
   Subjective:    Patient ID: Robert Hobbs, male    DOB: 05-29-1956, 58 y.o.   MRN: [id number]  HPI  Robert Hobbs is a 58 yr old male who presents today for follow up.  1) Anxiety- the patient is maintained on zoloft.  Reports that his mood is good.  Feels better if he takes at night. Denies depression.    2) HTN- patient is maintained on diovan-HCT  3) Cough-cough has been intermittent x 2 months.  Robert Hobbs notes intermittent wheezing.  Claritin-D helps for a short time.  Reports that he continues to have nasal drainage.  Sometimes feels like he cant' get a deep breath. Was treated for asthma as a child.    Review of Systems See HPI  Past Medical History  Diagnosis Date  . Hypertension     History   Social History  . Marital Status: Married    Spouse Name: N/A    Number of Children: N/A  . Years of Education: N/A   Occupational History  . Not on file.   Social History Main Topics  . Smoking status: Never Smoker   . Smokeless tobacco: Not on file  . Alcohol Use: Not on file  . Drug Use: Not on file  . Sexual Activity: Not on file   Other Topics Concern  . Not on file   Social History Narrative  . No narrative on file    No past surgical history on file.  No family history on file.  Allergies  Allergen Reactions  . Cleocin [Clindamycin Hcl] Nausea And Vomiting  . Shellfish Allergy     Current Outpatient Prescriptions on File Prior to Visit  Medication Sig Dispense Refill  . sertraline (ZOLOFT) 100 MG tablet Take 1 tablet (100 mg total) by mouth daily.  30 tablet  1  . valsartan-hydrochlorothiazide (DIOVAN-HCT) 160-12.5 MG per tablet TAKE ONE TABLET BY MOUTH ONCE DAILY  30 tablet  3   No current facility-administered medications on file prior to visit.    BP 118/90  Pulse 77  Temp(Src) 97.8 F (36.6 C) (Oral)  Resp 16  Wt 200 lb 1.3 oz (90.756 kg)  SpO2 97%       Objective:   Physical Exam  Constitutional: He is oriented to person, place, and  time. He appears well-developed and well-nourished. No distress.  HENT:  Head: Normocephalic and atraumatic.  Cardiovascular: Normal rate and regular rhythm.   Murmur heard.  Systolic murmur is present with a grade of 1/6  Musculoskeletal: He exhibits no edema.  Neurological: He is alert and oriented to person, place, and time.  Skin: Skin is warm and dry.  Psychiatric: He has a normal mood and affect. His behavior is normal. Judgment and thought content normal.          Assessment & Plan:  He wishes to complete fasting labs today and will schedule cpx in near future. Discussed pros/cons of PSA- he declines.

## 2014-02-25 LAB — URINALYSIS, MICROSCOPIC ONLY
Bacteria, UA: NONE SEEN
CASTS: NONE SEEN
Crystals: NONE SEEN
SQUAMOUS EPITHELIAL / LPF: NONE SEEN

## 2014-02-25 LAB — T4, FREE: Free T4: 0.86 ng/dL (ref 0.80–1.80)

## 2014-02-25 LAB — URINALYSIS, ROUTINE W REFLEX MICROSCOPIC
Bilirubin Urine: NEGATIVE
GLUCOSE, UA: NEGATIVE mg/dL
HGB URINE DIPSTICK: NEGATIVE
Ketones, ur: NEGATIVE mg/dL
Nitrite: NEGATIVE
PH: 6 (ref 5.0–8.0)
PROTEIN: NEGATIVE mg/dL
Specific Gravity, Urine: 1.012 (ref 1.005–1.030)
Urobilinogen, UA: 0.2 mg/dL (ref 0.0–1.0)

## 2014-02-25 LAB — T3, FREE: T3, Free: 2.5 pg/mL (ref 2.3–4.2)

## 2014-02-26 ENCOUNTER — Telehealth: Payer: Self-pay | Admitting: Family

## 2014-02-26 DIAGNOSIS — E038 Other specified hypothyroidism: Secondary | ICD-10-CM | POA: Insufficient documentation

## 2014-02-26 DIAGNOSIS — E039 Hypothyroidism, unspecified: Secondary | ICD-10-CM | POA: Insufficient documentation

## 2014-02-26 DIAGNOSIS — E785 Hyperlipidemia, unspecified: Secondary | ICD-10-CM | POA: Insufficient documentation

## 2014-02-26 MED ORDER — LEVOTHYROXINE SODIUM 50 MCG PO TABS: 50.0000 ug | ORAL_TABLET | Freq: Every day | ORAL | Status: DC

## 2014-02-26 NOTE — Telephone Encounter (Signed)
Notified pt and he states he has had some of all of these symptoms and is agreeable to start medication. Lab order entered. Pt would like rx to go to [facility] [place].

## 2014-02-26 NOTE — Telephone Encounter (Signed)
Please contact pt and let him know that blood work shows borderline hypothyroid.  Symptoms of hypothyroid include:  Fatigue, dry skin, weight gain, constipation, muscle pain. If he is experiencing any of these symptoms, then I would recommend starting thyroid medication and repeating lab work in 6 weeks.  If not, lets have him repeat TSH, Freet3/free t4 in 3 months.  Cholesterol is elevated. Please work on low fat/low cholesterol diet.

## 2014-03-15 ENCOUNTER — Encounter: Payer: Self-pay | Admitting: Family

## 2014-05-10 ENCOUNTER — Telehealth: Payer: Self-pay | Admitting: Family

## 2014-05-10 MED ORDER — LEVOTHYROXINE SODIUM 50 MCG PO TABS: 50.0000 ug | ORAL_TABLET | Freq: Every day | ORAL | Status: DC

## 2014-05-10 NOTE — Telephone Encounter (Signed)
Rx request to pharmacy/[facility]  

## 2014-05-10 NOTE — Telephone Encounter (Signed)
Pt is needing new rx levothyroxine (SYNTHROID, LEVOTHROID) 50 MCG tablet Send to [facility] [place].

## 2014-07-20 ENCOUNTER — Other Ambulatory Visit: Payer: Self-pay | Admitting: Family

## 2014-08-25 ENCOUNTER — Other Ambulatory Visit: Payer: Self-pay | Admitting: Family

## 2014-08-25 NOTE — Telephone Encounter (Signed)
30 day supply of levothyroxine sent to pharmacy. Pt is due for follow up this month.  Please call pt to arrange appointment before further refills are due.

## 2014-08-25 NOTE — Telephone Encounter (Signed)
Informed patient of medication refill and he declined to schedule appointment.

## 2016-06-19 ENCOUNTER — Ambulatory Visit (INDEPENDENT_AMBULATORY_CARE_PROVIDER_SITE_OTHER): Payer: Self-pay | Admitting: Family

## 2016-06-19 ENCOUNTER — Telehealth: Payer: Self-pay | Admitting: Family

## 2016-06-19 ENCOUNTER — Encounter: Payer: Self-pay | Admitting: Family

## 2016-06-19 VITALS — BP 122/71 | HR 65 | Temp 97.8°F | Resp 18 | Ht 71.0 in | Wt 213.8 lb

## 2016-06-19 DIAGNOSIS — R011 Cardiac murmur, unspecified: Secondary | ICD-10-CM

## 2016-06-19 DIAGNOSIS — I10 Essential (primary) hypertension: Secondary | ICD-10-CM

## 2016-06-19 DIAGNOSIS — Z23 Encounter for immunization: Secondary | ICD-10-CM

## 2016-06-19 DIAGNOSIS — E038 Other specified hypothyroidism: Secondary | ICD-10-CM

## 2016-06-19 DIAGNOSIS — E039 Hypothyroidism, unspecified: Secondary | ICD-10-CM

## 2016-06-19 DIAGNOSIS — F419 Anxiety disorder, unspecified: Secondary | ICD-10-CM

## 2016-06-19 LAB — TSH: TSH: 5.53 u[IU]/mL — ABNORMAL HIGH (ref 0.35–4.50)

## 2016-06-19 LAB — BASIC METABOLIC PANEL
BUN: 18 mg/dL (ref 6–23)
CALCIUM: 9.8 mg/dL (ref 8.4–10.5)
CO2: 29 meq/L (ref 19–32)
CREATININE: 1.2 mg/dL (ref 0.40–1.50)
Chloride: 100 mEq/L (ref 96–112)
GFR: 65.36 mL/min (ref >60.00)
GLUCOSE: 103 mg/dL — AB (ref 70–99)
Potassium: 3.7 mEq/L (ref 3.5–5.1)
Sodium: 139 mEq/L (ref 135–145)

## 2016-06-19 MED ORDER — LEVOTHYROXINE SODIUM 75 MCG PO TABS: 75.0000 ug | ORAL_TABLET | Freq: Every day | ORAL | 0 refills | Status: DC

## 2016-06-19 MED ORDER — VALSARTAN-HYDROCHLOROTHIAZIDE 160-12.5 MG PO TABS: ORAL_TABLET | ORAL | 1 refills | Status: DC

## 2016-06-19 MED ORDER — MONTELUKAST SODIUM 10 MG PO TABS: 10.0000 mg | ORAL_TABLET | Freq: Every day | ORAL | 1 refills | Status: DC

## 2016-06-19 MED ORDER — SERTRALINE HCL 100 MG PO TABS: 100.0000 mg | ORAL_TABLET | Freq: Two times a day (BID) | ORAL | 1 refills | Status: DC

## 2016-06-19 NOTE — Progress Notes (Signed)
Pre visit review using our clinic review tool, if applicable. No additional management support is needed unless otherwise documented below in the visit note. 

## 2016-06-19 NOTE — Assessment & Plan Note (Signed)
Obtain follow up TSH, continue synthroid.  

## 2016-06-19 NOTE — Patient Instructions (Signed)
Please complete lab work prior to leaving. Consider scheduling an appointment with a counselor. You will be contacted about your echocardiogram to further evaluate your heart murmur.  Follow up as scheduled in November.

## 2016-06-19 NOTE — Telephone Encounter (Signed)
Left message for pt to return my call.

## 2016-06-19 NOTE — Assessment & Plan Note (Signed)
Improved on sertraline. We did discuss considering establishing with a therapist for additional help with anger management.

## 2016-06-19 NOTE — Assessment & Plan Note (Signed)
Stable on current medications 

## 2016-06-19 NOTE — Progress Notes (Signed)
Subjective:    Patient ID: Robert Hobbs, male    DOB: October 07, 1955, 61 y.o.   MRN: [id number]  HPI   Robert Hobbs is a 60 yr old male who presents today for follow up.  1) HTN- he is currently maintained on diovan-hct BP Readings from Last 3 Encounters:  06/19/16 122/71  02/24/14 118/90  10/12/13 110/70   2) Anxiety-  On sertaline 131m bid. Reports that sometimes mood is stable.  Denies depression.  Reports that he feels that sertraline is helping. Notes he becomes anxious if he is pushed out of his realm of comfort. He has not met with a therapist.    3) hypothyroid- reports that his energy is good.   Lab Results  Component Value Date   TSH 6.376 (H) 02/24/2014   Maintained on synthroid 50 mcg.     Review of Systems  Respiratory:       Reports that he has sob which seems to be related to allergies.  Notes some wheezing, relieved by albuterol  Cardiovascular: Negative for chest pain.       Some pedal edema after prolonged sitting       Past Medical History:  Diagnosis Date  . Hypertension   . Mitral valve prolapse 02/24/2014     Social History   Social History  . Marital status: Married    Spouse name: N/A  . Number of children: N/A  . Years of education: N/A   Occupational History  . Not on file.   Social History Main Topics  . Smoking status: Never Smoker  . Smokeless tobacco: Not on file  . Alcohol use Not on file  . Drug use: Unknown  . Sexual activity: Not on file   Other Topics Concern  . Not on file   Social History Narrative   He is separated   He has a 147year old  (daughter) and 182yr old (daughter) 238yr old (daughter) and 232year (son) 271year old daughter   Long distance truck driver   Enjoys biking but does not have much time to do that       No past surgical history on file.  No family history on file.  Allergies  Allergen Reactions  . Cleocin [Clindamycin Hcl] Nausea And Vomiting  . Shellfish Allergy     Current Outpatient  Prescriptions on File Prior to Visit  Medication Sig Dispense Refill  . albuterol (PROVENTIL HFA;VENTOLIN HFA) 108 (90 BASE) MCG/ACT inhaler Inhale 2 puffs into the lungs every 6 (six) hours as needed for wheezing or shortness of breath. 1 Inhaler 5  . levothyroxine (SYNTHROID, LEVOTHROID) 50 MCG tablet TAKE ONE TABLET BY MOUTH ONCE DAILY 30 tablet 0   No current facility-administered medications on file prior to visit.     BP 122/71 (BP Location: Right Arm, Patient Position: Sitting, Cuff Size: Normal)   Pulse 65   Temp 97.8 F (36.6 C) (Oral)   Resp 18   Ht 5' 11"  (1.803 m)   Wt 213 lb 12.8 oz (97 kg)   SpO2 98% Comment: RA  BMI 29.82 kg/m    Objective:   Physical Exam  Constitutional: He is oriented to person, place, and time. He appears well-developed and well-nourished. No distress.  HENT:  Head: Normocephalic and atraumatic.  Cardiovascular: Normal rate and regular rhythm.   Murmur heard.  Systolic murmur is present with a grade of 2/6  Pulmonary/Chest: Effort normal and breath sounds normal. No respiratory distress.  He has no wheezes. He has no rales.  Musculoskeletal: He exhibits no edema.  Neurological: He is alert and oriented to person, place, and time.  Skin: Skin is warm and dry.  Psychiatric: He has a normal mood and affect. His behavior is normal. Thought content normal.          Assessment & Plan:  Cardiac murmur- will obtain 2D echo to further evaluate.

## 2016-06-19 NOTE — Telephone Encounter (Signed)
Please let pt know that lab work shows thyroid medication is not strong enough. Please increase synthroid from 50 mcg to , repeat tsh in 4-6 weeks.  Kidney function is normal.  Sugar is mildly elevated but not in the diabetic region. We will continue to monitor sugar.

## 2016-06-20 NOTE — Telephone Encounter (Signed)
Patient is returning your call.   Contact phone: [phone]

## 2016-06-20 NOTE — Telephone Encounter (Signed)
Notified pt and he voices understanding. We did not schedule lab visit as pt is an "over the road truck driver". 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 He will be having physical on 07/10/16 and then 08/08/16. Advised pt to discuss repeat TSH with PCP in November.

## 2016-06-22 ENCOUNTER — Telehealth: Payer: Self-pay | Admitting: Family

## 2016-06-22 NOTE — Telephone Encounter (Signed)
Pt says that he is having a echocardiogram on 07/11/16, and is scheduled for CPE on 07/10/16. Pt would like to know if echo is needed before CPE ? If so, pt will reschedule cpe until after.     Please advise. I will call pt back to make him aware.

## 2016-06-22 NOTE — Telephone Encounter (Signed)
No, pt does not need echo before cpe. Thanks!

## 2016-06-22 NOTE — Telephone Encounter (Signed)
Thanks!.    I have made pt aware.

## 2016-06-27 ENCOUNTER — Ambulatory Visit (HOSPITAL_BASED_OUTPATIENT_CLINIC_OR_DEPARTMENT_OTHER): Payer: Self-pay

## 2016-07-10 ENCOUNTER — Ambulatory Visit (INDEPENDENT_AMBULATORY_CARE_PROVIDER_SITE_OTHER): Payer: Self-pay | Admitting: Family

## 2016-07-10 ENCOUNTER — Encounter: Payer: Self-pay | Admitting: Family

## 2016-07-10 VITALS — BP 130/84 | HR 66 | Temp 97.8°F | Resp 18 | Ht 71.0 in | Wt 217.2 lb

## 2016-07-10 DIAGNOSIS — R739 Hyperglycemia, unspecified: Secondary | ICD-10-CM

## 2016-07-10 DIAGNOSIS — Z0000 Encounter for general adult medical examination without abnormal findings: Secondary | ICD-10-CM

## 2016-07-10 DIAGNOSIS — Z23 Encounter for immunization: Secondary | ICD-10-CM

## 2016-07-10 LAB — CBC WITH DIFFERENTIAL/PLATELET
BASOS ABS: 0.1 10*3/uL (ref 0.0–0.1)
BASOS PCT: 1.5 % (ref 0.0–3.0)
EOS ABS: 0.3 10*3/uL (ref 0.0–0.7)
Eosinophils Relative: 5.6 % — ABNORMAL HIGH (ref 0.0–5.0)
HCT: 44.3 % (ref 39.0–52.0)
HEMOGLOBIN: 15.2 g/dL (ref 13.0–17.0)
Lymphocytes Relative: 27.8 % (ref 12.0–46.0)
Lymphs Abs: 1.4 10*3/uL (ref 0.7–4.0)
MCHC: 34.3 g/dL (ref 30.0–36.0)
MCV: 92 fl (ref 78.0–100.0)
MONO ABS: 0.4 10*3/uL (ref 0.1–1.0)
Monocytes Relative: 8 % (ref 3.0–12.0)
Neutro Abs: 2.8 10*3/uL (ref 1.4–7.7)
Neutrophils Relative %: 57.1 % (ref 43.0–77.0)
PLATELETS: 211 10*3/uL (ref 150.0–400.0)
RBC: 4.82 Mil/uL (ref 4.22–5.81)
RDW: 12.6 % (ref 11.5–15.5)
WBC: 4.9 10*3/uL (ref 4.0–10.5)

## 2016-07-10 LAB — PSA: PSA: 1.68 ng/mL (ref 0.10–4.00)

## 2016-07-10 LAB — URINALYSIS, ROUTINE W REFLEX MICROSCOPIC
Bilirubin Urine: NEGATIVE
HGB URINE DIPSTICK: NEGATIVE
KETONES UR: NEGATIVE
LEUKOCYTES UA: NEGATIVE
NITRITE: NEGATIVE
PH: 6 (ref 5.0–8.0)
RBC / HPF: NONE SEEN (ref 0–?)
Specific Gravity, Urine: 1.01 (ref 1.000–1.030)
TOTAL PROTEIN, URINE-UPE24: NEGATIVE
URINE GLUCOSE: NEGATIVE
UROBILINOGEN UA: 0.2 (ref 0.0–1.0)
WBC, UA: NONE SEEN (ref 0–?)

## 2016-07-10 LAB — HEPATIC FUNCTION PANEL
ALK PHOS: 68 U/L (ref 39–117)
ALT: 18 U/L (ref 0–53)
AST: 20 U/L (ref 0–37)
Albumin: 4.1 g/dL (ref 3.5–5.2)
BILIRUBIN DIRECT: 0.1 mg/dL (ref 0.0–0.3)
BILIRUBIN TOTAL: 0.4 mg/dL (ref 0.2–1.2)
Total Protein: 6.9 g/dL (ref 6.0–8.3)

## 2016-07-10 LAB — TSH: TSH: 3.32 u[IU]/mL (ref 0.35–4.50)

## 2016-07-10 LAB — LIPID PANEL
CHOL/HDL RATIO: 4
Cholesterol: 228 mg/dL — ABNORMAL HIGH (ref 0–200)
HDL: 53.8 mg/dL (ref >39.00)
LDL CALC: 153 mg/dL — AB (ref 0–99)
NonHDL: 173.92
TRIGLYCERIDES: 105 mg/dL (ref 0.0–149.0)
VLDL: 21 mg/dL (ref 0.0–40.0)

## 2016-07-10 LAB — BASIC METABOLIC PANEL
BUN: 20 mg/dL (ref 6–23)
CALCIUM: 9.4 mg/dL (ref 8.4–10.5)
CHLORIDE: 105 meq/L (ref 96–112)
CO2: 29 mEq/L (ref 19–32)
CREATININE: 1.19 mg/dL (ref 0.40–1.50)
GFR: 65.98 mL/min (ref >60.00)
Glucose, Bld: 88 mg/dL (ref 70–99)
Potassium: 5.1 mEq/L (ref 3.5–5.1)
Sodium: 140 mEq/L (ref 135–145)

## 2016-07-10 LAB — HEMOGLOBIN A1C: Hgb A1c MFr Bld: 5.3 % (ref 4.6–6.5)

## 2016-07-10 NOTE — Patient Instructions (Addendum)
You will be contacted about your referral to GI re: your colonoscopy.  Please complete lab work prior to leaving.

## 2016-07-10 NOTE — Progress Notes (Signed)
Subjective:    Patient ID: Robert Hobbs, male    DOB: 17-Apr-1956, 60 y.o.Robert Hobbs   MRN: [id number]  HPI  Robert Hobbs is a 60 yr old male who presents today for complete physical.  Immunizations: unsure of last tetanus shot- due Diet: diet is healthy Exercise:  Fair  Colonoscopy: due Vision:  Up to date Dental: due   Review of Systems  Constitutional: Negative for unexpected weight change.  HENT: Negative for hearing loss and rhinorrhea.   Eyes: Negative for visual disturbance.  Respiratory: Negative for cough.   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  Genitourinary: Negative for dysuria.       + urinary frequency for the last few years.  Nocturia x 1.  Reports good urinary stream.         Past Medical History:  Diagnosis Date  . Hypertension   . Mitral valve prolapse 02/24/2014     Social History   Social History  . Marital status: Married    Spouse name: N/A  . Number of children: N/A  . Years of education: N/A   Occupational History  . Not on file.   Social History Main Topics  . Smoking status: Never Smoker  . Smokeless tobacco: Not on file  . Alcohol use Not on file  . Drug use: Unknown  . Sexual activity: Not on file   Other Topics Concern  . Not on file   Social History Narrative   He is separated   He has a 61 year old  (daughter) and 61 yr old (daughter) 61 yr old (daughter) and 6923 year (son) 61 year old daughter   Long distance truck driver   Enjoys biking but does not have much time to do that       No past surgical history on file.  No family history on file.  Allergies  Allergen Reactions  . Cleocin [Clindamycin Hcl] Nausea And Vomiting  . Shellfish Allergy     Current Outpatient Prescriptions on File Prior to Visit  Medication Sig Dispense Refill  . albuterol (PROVENTIL HFA;VENTOLIN HFA) 108 (90 BASE) MCG/ACT inhaler Inhale 2 puffs into the  lungs every 6 (six) hours as needed for wheezing or shortness of breath. 1 Inhaler 5  . levothyroxine (SYNTHROID, LEVOTHROID) 75 MCG tablet Take 1 tablet (75 mcg total) by mouth daily. 90 tablet 0  . montelukast (SINGULAIR) 10 MG tablet Take 1 tablet (10 mg total) by mouth at bedtime. 90 tablet 1  . sertraline (ZOLOFT) 100 MG tablet Take 1 tablet (100 mg total) by mouth 2 (two) times daily. 180 tablet 1  . valsartan-hydrochlorothiazide (DIOVAN-HCT) 160-12.5 MG tablet TAKE ONE TABLET BY MOUTH ONCE DAILY 90 tablet 1   No current facility-administered medications on file prior to visit.     BP 130/84 (BP Location: Right Arm, Patient Position: Sitting, Cuff Size: Large)   Pulse 66   Temp 97.8 F (36.6 C) (Oral)   Resp 18   Ht 5\' 11"  (1.803 m)   Wt 217 lb 3.2 oz (98.5 kg)   SpO2 99% Comment: RA  BMI 30.29 kg/m    Objective:   Physical Exam  Physical Exam  Constitutional: He is oriented to person, place, and time. He appears well-developed and well-nourished. No distress.  HENT:  Head: Normocephalic and atraumatic.  Right Ear: Tympanic membrane and ear canal normal.  Left Ear:  Tympanic membrane and ear canal normal.  Mouth/Throat: Oropharynx is clear and moist.  Eyes: Pupils are equal, round, and reactive to light. No scleral icterus.  Neck: Normal range of motion. No thyromegaly present.  Cardiovascular: Normal rate and regular rhythm.   Grade 2 systolic murmur heard. Pulmonary/Chest: Effort normal and breath sounds normal. No respiratory distress. He has no wheezes. He has no rales. He exhibits no tenderness.  Abdominal: Soft. Bowel sounds are normal. He exhibits no distension and no mass. There is no tenderness. There is no rebound and no guarding.  Musculoskeletal: He exhibits no edema.  Lymphadenopathy:    He has no cervical adenopathy.  Neurological: He is alert and oriented to person, place, and time. He has normal patellar reflexes. He exhibits normal muscle tone.  Coordination normal.  Skin: Skin is warm and dry.  Psychiatric: He has a normal mood and affect. His behavior is normal. Judgment and thought content normal.           Assessment & Plan:   Preventative care- Tdap today, discussed healthy diet/exercise. Obtain routine lab work including PSA (discussed pros/cons).  He will return for nurse visit tomorrow for a routine EKG (machine is out of order today).       Assessment & Plan:

## 2016-07-10 NOTE — Progress Notes (Signed)
Pre visit review using our clinic review tool, if applicable. No additional management support is needed unless otherwise documented below in the visit note. 

## 2016-07-11 ENCOUNTER — Ambulatory Visit (INDEPENDENT_AMBULATORY_CARE_PROVIDER_SITE_OTHER): Payer: Self-pay | Admitting: Family Medicine

## 2016-07-11 ENCOUNTER — Ambulatory Visit (HOSPITAL_BASED_OUTPATIENT_CLINIC_OR_DEPARTMENT_OTHER)
Admission: RE | Admit: 2016-07-11 | Discharge: 2016-07-11 | Disposition: A | Discharge status: Home / Self Care | Payer: Self-pay | Source: Ambulatory Visit | Attending: Family | Admitting: Family

## 2016-07-11 DIAGNOSIS — R011 Cardiac murmur, unspecified: Secondary | ICD-10-CM | POA: Insufficient documentation

## 2016-07-11 DIAGNOSIS — Z0000 Encounter for general adult medical examination without abnormal findings: Secondary | ICD-10-CM

## 2016-07-11 DIAGNOSIS — I5189 Other ill-defined heart diseases: Secondary | ICD-10-CM | POA: Insufficient documentation

## 2016-07-11 DIAGNOSIS — I351 Nonrheumatic aortic (valve) insufficiency: Secondary | ICD-10-CM | POA: Insufficient documentation

## 2016-07-11 DIAGNOSIS — I350 Nonrheumatic aortic (valve) stenosis: Secondary | ICD-10-CM | POA: Insufficient documentation

## 2016-07-11 NOTE — Progress Notes (Signed)
Echocardiogram 2D Echocardiogram has been performed.  Robert Hobbs 07/11/2016, 11:27 AM

## 2016-07-11 NOTE — Progress Notes (Signed)
Pre visit review using our clinic review tool, if applicable. No additional management support is needed unless otherwise documented below in the visit note.  Patient in office today for routine EKG per OV note 07/10/16. Procedure was tolerated well.  NSR on EKG today- JC I do not see where he was having any CP or other symptoms- this was done as he has htn per my understanding

## 2016-07-15 ENCOUNTER — Encounter: Payer: Self-pay | Admitting: Family

## 2016-07-15 ENCOUNTER — Other Ambulatory Visit: Payer: Self-pay | Admitting: Family

## 2016-07-15 DIAGNOSIS — E785 Hyperlipidemia, unspecified: Secondary | ICD-10-CM

## 2016-07-15 DIAGNOSIS — I350 Nonrheumatic aortic (valve) stenosis: Secondary | ICD-10-CM

## 2016-07-15 HISTORY — DX: Nonrheumatic aortic (valve) stenosis: I35.0

## 2016-07-15 NOTE — Telephone Encounter (Signed)
Also, cholesterol is elevated and I would recommend that he start atorvastatin 20mg  and repeat lipids in 6 weeks. Urine, thyroid, sugar, blood count, kidney function all look good.

## 2016-07-15 NOTE — Telephone Encounter (Signed)
Results of Echo show calcified aortic valved and that the valve is tight.  I would recommend consult with cardiology.

## 2016-07-16 NOTE — Telephone Encounter (Signed)
Called patient and left a message for call back  

## 2016-07-17 MED ORDER — ATORVASTATIN CALCIUM 20 MG PO TABS: 20.0000 mg | ORAL_TABLET | Freq: Every day | ORAL | 0 refills | Status: DC

## 2016-07-17 NOTE — Telephone Encounter (Signed)
Left message for pt to return my call.

## 2016-07-17 NOTE — Telephone Encounter (Signed)
Notified pt and he voices understanding. Pt is agreeable to proceed with referral and start medication. Lab appt scheduled for 08/22/16 at 9am as pt states he will be leaving on 08/23/16 (travels on his job). Future lab order entered.

## 2016-08-03 ENCOUNTER — Encounter: Payer: Self-pay | Admitting: Cardiovascular Disease

## 2016-08-09 ENCOUNTER — Encounter: Payer: Self-pay | Admitting: Family

## 2016-08-22 ENCOUNTER — Other Ambulatory Visit (INDEPENDENT_AMBULATORY_CARE_PROVIDER_SITE_OTHER): Payer: PRIVATE HEALTH INSURANCE

## 2016-08-22 DIAGNOSIS — E785 Hyperlipidemia, unspecified: Secondary | ICD-10-CM | POA: Diagnosis not present

## 2016-08-22 LAB — LIPID PANEL
CHOL/HDL RATIO: 3
Cholesterol: 176 mg/dL (ref 0–200)
HDL: 50.5 mg/dL (ref >39.00)
LDL Cholesterol: 95 mg/dL (ref 0–99)
NONHDL: 125.55
Triglycerides: 154 mg/dL — ABNORMAL HIGH (ref 0.0–149.0)
VLDL: 30.8 mg/dL (ref 0.0–40.0)

## 2016-08-23 ENCOUNTER — Encounter (INDEPENDENT_AMBULATORY_CARE_PROVIDER_SITE_OTHER): Payer: Self-pay

## 2016-08-23 ENCOUNTER — Encounter: Payer: Self-pay | Admitting: Family

## 2016-08-23 ENCOUNTER — Ambulatory Visit (INDEPENDENT_AMBULATORY_CARE_PROVIDER_SITE_OTHER): Payer: PRIVATE HEALTH INSURANCE | Admitting: Interventional Cardiology

## 2016-08-23 ENCOUNTER — Encounter: Payer: Self-pay | Admitting: Interventional Cardiology

## 2016-08-23 VITALS — BP 148/80 | HR 82 | Ht 72.0 in | Wt 213.0 lb

## 2016-08-23 DIAGNOSIS — I10 Essential (primary) hypertension: Secondary | ICD-10-CM | POA: Diagnosis not present

## 2016-08-23 DIAGNOSIS — E782 Mixed hyperlipidemia: Secondary | ICD-10-CM | POA: Diagnosis not present

## 2016-08-23 DIAGNOSIS — I712 Thoracic aortic aneurysm, without rupture, unspecified: Secondary | ICD-10-CM | POA: Insufficient documentation

## 2016-08-23 DIAGNOSIS — I350 Nonrheumatic aortic (valve) stenosis: Secondary | ICD-10-CM
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
## 2016-08-23 NOTE — Patient Instructions (Signed)
Medication Instructions:  Same-no changes  Labwork: None  Testing/Procedures: Dr Eldridge DaceVaranasi recommends that you have a CT Angio of your chest.  Follow-Up: Your physician wants you to follow-up in: 1 year. You will receive a reminder letter in the mail two months in advance. If you don't receive a letter, please call our office to schedule the follow-up appointment.     If you need a refill on your cardiac medications before your next appointment, please call your pharmacy.

## 2016-10-25 ENCOUNTER — Other Ambulatory Visit: Payer: Self-pay

## 2016-10-25 DIAGNOSIS — I10 Essential (primary) hypertension: Secondary | ICD-10-CM

## 2016-10-29 ENCOUNTER — Other Ambulatory Visit: Payer: PRIVATE HEALTH INSURANCE

## 2016-11-01 ENCOUNTER — Other Ambulatory Visit: Payer: Self-pay | Admitting: Family

## 2016-11-01 MED ORDER — ATORVASTATIN CALCIUM 20 MG PO TABS: 20.0000 mg | ORAL_TABLET | Freq: Every day | ORAL | 0 refills | Status: DC

## 2016-11-01 MED ORDER — ALBUTEROL SULFATE HFA 108 (90 BASE) MCG/ACT IN AERS: 2.0000 | INHALATION_SPRAY | Freq: Four times a day (QID) | RESPIRATORY_TRACT | 5 refills | Status: DC | PRN

## 2016-11-01 NOTE — Telephone Encounter (Signed)
Pt hasn't been seen since 07/11/16 when should she follow up

## 2016-11-01 NOTE — Telephone Encounter (Signed)
Notified pt the Rx was sent to pharmacy.

## 2016-11-01 NOTE — Telephone Encounter (Signed)
Caller name: Robert AdolphJeffrey A Ackman Relation to pt: self  Call back number: [phone] Pharmacy: [facility] [address] [place] - [address]  Reason for call: Pt states only has one pill left for atorvastatin (LIPITOR) 20 MG tablet and is also needing albuterol (PROVENTIL HFA;VENTOLIN HFA) 108 (90 BASE) MCG/ACT inhaler. Pt states is leaving out of town tomorrow early in the morning (7:00 am) and is going to be out of town for 30 days and stated did not realized had lest pills left. Pt advise ASAP.

## 2016-11-12 ENCOUNTER — Ambulatory Visit (INDEPENDENT_AMBULATORY_CARE_PROVIDER_SITE_OTHER)
Admission: RE | Admit: 2016-11-12 | Discharge: 2016-11-12 | Disposition: A | Discharge status: Home / Self Care | Payer: PRIVATE HEALTH INSURANCE | Source: Ambulatory Visit | Attending: Interventional Cardiology | Admitting: Interventional Cardiology

## 2016-11-12 ENCOUNTER — Encounter (INDEPENDENT_AMBULATORY_CARE_PROVIDER_SITE_OTHER): Payer: Self-pay

## 2016-11-12 ENCOUNTER — Other Ambulatory Visit: Payer: PRIVATE HEALTH INSURANCE | Admitting: *Deleted

## 2016-11-12 DIAGNOSIS — I712 Thoracic aortic aneurysm, without rupture, unspecified: Secondary | ICD-10-CM

## 2016-11-12 DIAGNOSIS — I10 Essential (primary) hypertension: Secondary | ICD-10-CM

## 2016-11-12 LAB — BASIC METABOLIC PANEL
BUN/Creatinine Ratio: 15 (ref 10–24)
BUN: 18 mg/dL (ref 8–27)
CALCIUM: 9.9 mg/dL (ref 8.6–10.2)
CO2: 33 mmol/L — AB (ref 18–29)
CREATININE: 1.19 mg/dL (ref 0.76–1.27)
Chloride: 102 mmol/L (ref 96–106)
GFR calc Af Amer: 76 mL/min/{1.73_m2} (ref >59)
GFR, EST NON AFRICAN AMERICAN: 66 mL/min/{1.73_m2} (ref >59)
Glucose: 92 mg/dL (ref 65–99)
POTASSIUM: 4.1 mmol/L (ref 3.5–5.2)
Sodium: 140 mmol/L (ref 134–144)

## 2016-11-12 MED ORDER — IOPAMIDOL (ISOVUE-370) INJECTION 76%
100.0000 mL | Freq: Once | INTRAVENOUS | Status: AC | PRN
  Administered 2016-11-12: 100 mL via INTRAVENOUS

## 2016-11-13 ENCOUNTER — Telehealth: Payer: Self-pay | Admitting: Interventional Cardiology

## 2016-11-13 NOTE — Telephone Encounter (Signed)
Patient made aware of results. Patient verbalizes understanding.  

## 2016-11-13 NOTE — Telephone Encounter (Signed)
-----   Message from Corky CraftsJayadeep S Varanasi, MD sent at 11/13/2016 12:47 PM EDT ----- Electrolytes stable.

## 2016-11-13 NOTE — Telephone Encounter (Signed)
New Message     Pt returning [place] call for lab results

## 2016-11-15 ENCOUNTER — Telehealth: Payer: Self-pay

## 2016-11-15 DIAGNOSIS — I712 Thoracic aortic aneurysm, without rupture, unspecified: Secondary | ICD-10-CM

## 2016-11-15 DIAGNOSIS — I350 Nonrheumatic aortic (valve) stenosis: Secondary | ICD-10-CM

## 2016-11-15 NOTE — Telephone Encounter (Signed)
Patient made aware of results. Patient referral sent to CT surgery. Patient made aware that CT surgery would be contacting him to make an appointment.  Patient verbalizes understanding.

## 2016-11-15 NOTE — Telephone Encounter (Signed)
-----   Message from Corky CraftsJayadeep S Varanasi, MD sent at 11/15/2016  1:52 PM EDT ----- MOderate aortic stenosis with moderate throacic aneurysm. WOuld refer to CT surgery so he is in the system.  I don't think they will recommend surgery at this time, but aneurysm big enough that it is reasonable for him to be evaluated once, in the event it gets bigger in the future.

## 2016-12-04 ENCOUNTER — Encounter: Payer: PRIVATE HEALTH INSURANCE | Admitting: Cardiothoracic Surgery

## 2016-12-05 ENCOUNTER — Other Ambulatory Visit: Payer: Self-pay | Admitting: Family

## 2016-12-06 NOTE — Telephone Encounter (Addendum)
eScribe request from [facility] for refill on Levothyroxine 75 mcg Last filled - 11/01/16 Refill sent per [facility] refill protocol/SLS  eScribe request from [facility] for refill on Atorvastatin 20 mg Last filled - 11/01/16, #90x0 Pharmacy does not open until 9am, will call and verify if received/SLS 04/19 Medication Detail :  Disp Refills Start End   atorvastatin (LIPITOR) 20 MG tablet 90 tablet 0 12/06/2016    Sig: TAKE 1 TABLET BY MOUTH ONCE DAILY   Class: Phone In   Notes to Pharmacy: May Rx per phone conversation with [facility] pharmacy; is to have on Hold for [employer].   Pharmacy  [facility] - [place] ([place]), [place] - [address]

## 2016-12-19 ENCOUNTER — Institutional Professional Consult (permissible substitution) (INDEPENDENT_AMBULATORY_CARE_PROVIDER_SITE_OTHER): Payer: Self-pay | Admitting: Cardiothoracic Surgery

## 2016-12-19 ENCOUNTER — Encounter: Payer: Self-pay | Admitting: Cardiothoracic Surgery

## 2016-12-19 VITALS — BP 127/82 | HR 79 | Resp 20 | Ht 72.0 in | Wt 209.0 lb

## 2016-12-19 DIAGNOSIS — I712 Thoracic aortic aneurysm, without rupture: Secondary | ICD-10-CM

## 2016-12-19 DIAGNOSIS — I7121 Aneurysm of the ascending aorta, without rupture: Secondary | ICD-10-CM

## 2016-12-19 DIAGNOSIS — I350 Nonrheumatic aortic (valve) stenosis: Secondary | ICD-10-CM
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
## 2016-12-31 ENCOUNTER — Encounter: Payer: Self-pay | Admitting: Family

## 2016-12-31 MED ORDER — MONTELUKAST SODIUM 10 MG PO TABS: 10.0000 mg | ORAL_TABLET | Freq: Every day | ORAL | 0 refills | Status: DC

## 2016-12-31 MED ORDER — VALSARTAN-HYDROCHLOROTHIAZIDE 160-12.5 MG PO TABS: ORAL_TABLET | ORAL | 0 refills | Status: DC

## 2016-12-31 MED ORDER — SERTRALINE HCL 100 MG PO TABS: 100.0000 mg | ORAL_TABLET | Freq: Two times a day (BID) | ORAL | 0 refills | Status: DC

## 2016-12-31 NOTE — Telephone Encounter (Signed)
Robert Hobbs-- I sent a 90 day supply of sertraline to pharmacy. Pt last had CPE 07/10/16 and has no follow up scheduled. When should pt return?

## 2017-01-15 NOTE — Telephone Encounter (Signed)
Please see unread mychart message. Would you please contact pt to arrange follow up?

## 2017-01-15 NOTE — Telephone Encounter (Signed)
Mychart message has not been read. Mailed letter to pt.

## 2017-02-25 ENCOUNTER — Ambulatory Visit: Payer: Self-pay | Admitting: Family

## 2017-03-25 ENCOUNTER — Encounter: Payer: Self-pay | Admitting: Family

## 2017-03-25 ENCOUNTER — Other Ambulatory Visit: Payer: Self-pay | Admitting: Family

## 2017-03-25 NOTE — Telephone Encounter (Signed)
Melissa-- please advise? 

## 2017-03-26 MED ORDER — MONTELUKAST SODIUM 10 MG PO TABS: 10.0000 mg | ORAL_TABLET | Freq: Every day | ORAL | 0 refills | Status: DC

## 2017-03-26 MED ORDER — SERTRALINE HCL 100 MG PO TABS: 100.0000 mg | ORAL_TABLET | Freq: Two times a day (BID) | ORAL | 0 refills | Status: DC

## 2017-03-26 MED ORDER — VALSARTAN-HYDROCHLOROTHIAZIDE 160-12.5 MG PO TABS: ORAL_TABLET | ORAL | 0 refills | Status: DC

## 2017-03-26 MED ORDER — LEVOTHYROXINE SODIUM 75 MCG PO TABS: 75.0000 ug | ORAL_TABLET | Freq: Every day | ORAL | 0 refills | Status: DC

## 2017-03-26 MED ORDER — ATORVASTATIN CALCIUM 20 MG PO TABS: 20.0000 mg | ORAL_TABLET | Freq: Every day | ORAL | 0 refills | Status: DC

## 2017-03-27 ENCOUNTER — Other Ambulatory Visit: Payer: Self-pay | Admitting: Family

## 2017-03-27 MED ORDER — LOSARTAN POTASSIUM-HCTZ 50-12.5 MG PO TABS: 1.0000 | ORAL_TABLET | Freq: Every day | ORAL | 2 refills | Status: DC

## 2017-03-27 NOTE — Telephone Encounter (Signed)
Caller name: Relation to WU:JWJXpt:self Call back [phone] [facility]  Reason for call: pt states that Melissa sent his bp rx in valsartan which is on recall, pt is needing an alternative rx and pt states he will be leaving out on the road tonight at 7 and needs the bp meds to take with him. Please call when rx is sent.

## 2017-03-27 NOTE — Telephone Encounter (Signed)
Patient notified

## 2017-03-27 NOTE — Telephone Encounter (Signed)
D/c valsartan hct, start losartan hct. Follow up as scheduled on 8/29.

## 2017-04-17 ENCOUNTER — Ambulatory Visit: Payer: Self-pay | Admitting: Family

## 2017-04-23 ENCOUNTER — Encounter: Payer: Self-pay | Admitting: Family

## 2017-04-23 ENCOUNTER — Ambulatory Visit (INDEPENDENT_AMBULATORY_CARE_PROVIDER_SITE_OTHER): Payer: Self-pay | Admitting: Family

## 2017-04-23 VITALS — BP 125/77 | HR 77 | Temp 98.7°F | Ht 70.0 in | Wt 202.0 lb

## 2017-04-23 DIAGNOSIS — F419 Anxiety disorder, unspecified: Secondary | ICD-10-CM

## 2017-04-23 DIAGNOSIS — Z1159 Encounter for screening for other viral diseases: Secondary | ICD-10-CM

## 2017-04-23 DIAGNOSIS — E782 Mixed hyperlipidemia: Secondary | ICD-10-CM

## 2017-04-23 DIAGNOSIS — E038 Other specified hypothyroidism: Secondary | ICD-10-CM

## 2017-04-23 DIAGNOSIS — I10 Essential (primary) hypertension: Secondary | ICD-10-CM

## 2017-04-23 DIAGNOSIS — Z1211 Encounter for screening for malignant neoplasm of colon: Secondary | ICD-10-CM

## 2017-04-23 DIAGNOSIS — J45909 Unspecified asthma, uncomplicated: Secondary | ICD-10-CM

## 2017-04-23 DIAGNOSIS — I712 Thoracic aortic aneurysm, without rupture, unspecified: Secondary | ICD-10-CM

## 2017-04-23 DIAGNOSIS — Z23 Encounter for immunization: Secondary | ICD-10-CM

## 2017-04-23 DIAGNOSIS — E039 Hypothyroidism, unspecified: Secondary | ICD-10-CM

## 2017-04-23 DIAGNOSIS — I350 Nonrheumatic aortic (valve) stenosis: Secondary | ICD-10-CM

## 2017-04-23 LAB — BASIC METABOLIC PANEL
BUN: 17 mg/dL (ref 6–23)
CALCIUM: 10.1 mg/dL (ref 8.4–10.5)
CO2: 26 meq/L (ref 19–32)
CREATININE: 1.26 mg/dL (ref 0.40–1.50)
Chloride: 101 mEq/L (ref 96–112)
GFR: 61.61 mL/min (ref >60.00)
Glucose, Bld: 95 mg/dL (ref 70–99)
Potassium: 4.2 mEq/L (ref 3.5–5.1)
Sodium: 137 mEq/L (ref 135–145)

## 2017-04-23 LAB — TSH: TSH: 4.8 u[IU]/mL — ABNORMAL HIGH (ref 0.35–4.50)

## 2017-04-23 MED ORDER — BECLOMETHASONE DIPROPIONATE 80 MCG/ACT IN AERS: 1.0000 | INHALATION_SPRAY | Freq: Two times a day (BID) | RESPIRATORY_TRACT | 5 refills | Status: DC

## 2017-04-23 NOTE — Assessment & Plan Note (Addendum)
Stable on zoloft. Continue same.  

## 2017-04-23 NOTE — Assessment & Plan Note (Signed)
Clinically stable following with cardiology.

## 2017-04-23 NOTE — Assessment & Plan Note (Signed)
Clinically stable, continue synthroid, obtain follow up tsh.

## 2017-04-23 NOTE — Assessment & Plan Note (Signed)
Stable, tolerating statin. Continue same.

## 2017-04-23 NOTE — Assessment & Plan Note (Signed)
Stable following annually with Dr. Donata ClayVan Trigt.

## 2017-04-23 NOTE — Assessment & Plan Note (Signed)
BP stable on current medication. Continue same. Obtain follow up bmet.

## 2017-04-23 NOTE — Patient Instructions (Addendum)
Please complete lab work prior to leaving. Start Qvar twice daily for asthma.  Follow up in 6 months, sooner if problems/concerns .

## 2017-04-23 NOTE — Progress Notes (Signed)
Subjective:    Patient ID: Robert Hobbs, male    DOB: 1955-09-08, 61 y.o.   MRN: [id number]  HPI  Robert Hobbs is a 61 yr old male who presents today for follow up. He is self pay. He would like a flu shot today.   HTN- current BP meds include hyzaar. Denies CP, swelling.  BP Readings from Last 3 Encounters:  04/23/17 125/77  12/19/16 127/82  08/23/16 (!) 148/80   Hyperlipidemia- maintained on lipitor.  Denies myalgia.   Lab Results  Component Value Date   CHOL 176 08/22/2016   HDL 50.50 08/22/2016   LDLCALC 95 08/22/2016   TRIG 154.0 (H) 08/22/2016   CHOLHDL 3 08/22/2016   Hypothyroid-Reports that he feels well on synthroid.  Lab Results  Component Value Date   TSH 3.32 07/10/2016   Anxiety- maintained on zoloft. Reports anxiety is stable. Denies depression.   Ascending Aortic aneurysm- saw Dr. Donata ClayVan Trigt and was recommended to follow up in 1 year for monitoring. AS- moderate- asymptomatic.  Asthma- continues singulair. Uses albuterol several times a day this time of year.  Review of Systems See HPI  Past Medical History:  Diagnosis Date  . Aortic stenosis   . Hypertension   . Mitral valve prolapse 02/24/2014     Social History   Social History  . Marital status: Married    Spouse name: N/A  . Number of children: N/A  . Years of education: N/A   Occupational History  . Not on file.   Social History Main Topics  . Smoking status: Never Smoker  . Smokeless tobacco: Never Used  . Alcohol use No  . Drug use: No  . Sexual activity: Not on file   Other Topics Concern  . Not on file   Social History Narrative   He is separated   He has a 132 year old  (daughter) and 62 yr old (daughter) 62 yr old (daughter) and 323 year (son) 62 year old daughter   Long distance truck driver   Enjoys biking but does not have much time to do that       Past Surgical History:  Procedure Laterality Date  . ARTHROSCOPIC REPAIR ACL Left 1996  . ORIF HUMERUS FRACTURE Right  1978    Family History  Problem Relation Age of Onset  . Anxiety disorder Mother   . Arrhythmia Mother        "died in her sleep" ? MI  . Alzheimer's disease Father   . Obesity Sister   . Prostate cancer Brother     Allergies  Allergen Reactions  . Cleocin [Clindamycin Hcl] Nausea And Vomiting  . Shellfish Allergy     Current Outpatient Prescriptions on File Prior to Visit  Medication Sig Dispense Refill  . albuterol (PROVENTIL HFA;VENTOLIN HFA) 108 (90 Base) MCG/ACT inhaler Inhale 2 puffs into the lungs every 6 (six) hours as needed for wheezing or shortness of breath. 1 Inhaler 5  . atorvastatin (LIPITOR) 20 MG tablet Take 1 tablet (20 mg total) by mouth daily. 30 tablet 0  . levothyroxine (SYNTHROID, LEVOTHROID) 75 MCG tablet Take 1 tablet (75 mcg total) by mouth daily. 30 tablet 0  . losartan-hydrochlorothiazide (HYZAAR) 50-12.5 MG tablet Take 1 tablet by mouth daily. 30 tablet 2  . montelukast (SINGULAIR) 10 MG tablet Take 1 tablet (10 mg total) by mouth at bedtime. 30 tablet 0  . sertraline (ZOLOFT) 100 MG tablet Take 1 tablet (100 mg total) by mouth 2 (  two) times daily. 60 tablet 0   No current facility-administered medications on file prior to visit.     BP 125/77   Pulse 77   Temp 98.7 F (37.1 C) (Oral)   Ht 5\' 10"  (1.778 m)   Wt 202 lb (91.6 kg)   SpO2 96%   BMI 28.98 kg/m       Objective:   Physical Exam  Constitutional: He is oriented to person, place, and time. He appears well-developed and well-nourished. No distress.  HENT:  Head: Normocephalic and atraumatic.  Cardiovascular: Normal rate and regular rhythm.   No murmur heard. Pulmonary/Chest: Effort normal and breath sounds normal. No respiratory distress. He has no wheezes. He has no rales.  Musculoskeletal: He exhibits no edema.  Neurological: He is alert and oriented to person, place, and time.  Skin: Skin is warm and dry.  Psychiatric: He has a normal mood and affect. His behavior is normal.  Thought content normal.          Assessment & Plan:

## 2017-04-23 NOTE — Assessment & Plan Note (Signed)
Uncontrolled. Add qvar.

## 2017-04-24 ENCOUNTER — Telehealth: Payer: Self-pay | Admitting: *Deleted

## 2017-04-24 MED ORDER — BECLOMETHASONE DIPROP HFA 80 MCG/ACT IN AERB: 1.0000 | INHALATION_SPRAY | Freq: Two times a day (BID) | RESPIRATORY_TRACT | 5 refills | Status: DC

## 2017-04-24 NOTE — Telephone Encounter (Signed)
[facility] to the Redihaler as regular inhaler is no longer available. Rx sent.

## 2017-04-25 ENCOUNTER — Other Ambulatory Visit: Payer: Self-pay | Admitting: Family

## 2017-04-25 ENCOUNTER — Encounter: Payer: Self-pay | Admitting: Gastroenterology

## 2017-04-25 DIAGNOSIS — E038 Other specified hypothyroidism: Secondary | ICD-10-CM

## 2017-04-25 DIAGNOSIS — E039 Hypothyroidism, unspecified: Secondary | ICD-10-CM

## 2017-04-25 NOTE — Telephone Encounter (Signed)
°  Relation to WU:JWJXpt:self Call back number:[phone] [facility] [address] - [place] ([address]), [place] [address] [phone] (Phone) [phone] (Fax)     Reason for call:  Patient is very upset stating he's going out of town today and medication was suppose to filled at the time of office visit on 04/23/17 patient would levothyroxine (SYNTHROID, LEVOTHROID) 75 MCG tablet and sertraline (ZOLOFT) 100 MG tablet with 5 refills for both medications sent today. Please advise

## 2017-04-26 ENCOUNTER — Other Ambulatory Visit: Payer: Self-pay | Admitting: Family

## 2017-04-26 ENCOUNTER — Encounter: Payer: Self-pay | Admitting: Family

## 2017-04-26 MED ORDER — LEVOTHYROXINE SODIUM 75 MCG PO TABS: 75.0000 ug | ORAL_TABLET | Freq: Every day | ORAL | 5 refills | Status: DC

## 2017-04-26 MED ORDER — LEVOTHYROXINE SODIUM 100 MCG PO TABS: 100.0000 ug | ORAL_TABLET | Freq: Every day | ORAL | 3 refills | Status: DC

## 2017-04-26 NOTE — Telephone Encounter (Signed)
Called and left message for pt to return my call. Also sent message via mychart. It appears that pt's levothyroxine needing adjusting based on most recent result. Lab result copied and forwarded to pt via mychart. Spoke with pharmacy and confirmed that they received sertraline and levothyroxine today and pt came in and told them he did not want to take sertraline any longer and did not pick it up. He did pick up levothyroxine Rx today.

## 2017-04-26 NOTE — Addendum Note (Signed)
Addended by: Mervin KungFERGERSON, Carliss Quast A on: 04/26/2017 05:08 PM   Modules accepted: Orders

## 2017-04-29 MED ORDER — ATORVASTATIN CALCIUM 20 MG PO TABS: 20.0000 mg | ORAL_TABLET | Freq: Every day | ORAL | 5 refills | Status: DC

## 2017-05-07 NOTE — Telephone Encounter (Signed)
Received fax from [facility] requesting refill of levothyroxine . Pt had previously picked up Rx on 04/26/17 and I had previously confirmed that with [facility]. Spoke with pharmacy again today and was assured that the rx had been discontinued and pt did pick up rx on 04/26/17. Current request discarded.

## 2017-05-31 ENCOUNTER — Telehealth: Payer: Self-pay | Admitting: Family

## 2017-05-31 MED ORDER — MONTELUKAST SODIUM 10 MG PO TABS: 10.0000 mg | ORAL_TABLET | Freq: Every day | ORAL | 1 refills | Status: DC

## 2017-05-31 NOTE — Telephone Encounter (Signed)
°  Relation to ZO:XWRU Call back [phone] Pharmacy: [facility] - [place] ([place]), [place] - [address]  Reason for call:  Patient requesting 90 supply of montelukast (SINGULAIR) 10 MG tablet, patient states he would like all medications 90 day due to the fact he was just seen, please advise

## 2017-05-31 NOTE — Telephone Encounter (Signed)
Refill sent, pt notified

## 2017-05-31 NOTE — Addendum Note (Signed)
Addended by: Mervin Kung A on: 05/31/2017 05:15 PM   Modules accepted: Orders

## 2017-06-02 MED ORDER — MONTELUKAST SODIUM 10 MG PO TABS: 10.0000 mg | ORAL_TABLET | Freq: Every day | ORAL | 1 refills | Status: DC

## 2017-06-02 NOTE — Addendum Note (Signed)
Addended by: Sandford Craze on: 06/02/2017 09:56 AM   Modules accepted: Orders

## 2017-06-07 ENCOUNTER — Other Ambulatory Visit (INDEPENDENT_AMBULATORY_CARE_PROVIDER_SITE_OTHER): Payer: Self-pay

## 2017-06-07 ENCOUNTER — Telehealth: Payer: Self-pay | Admitting: Family

## 2017-06-07 DIAGNOSIS — E038 Other specified hypothyroidism: Secondary | ICD-10-CM

## 2017-06-07 DIAGNOSIS — E039 Hypothyroidism, unspecified: Secondary | ICD-10-CM

## 2017-06-07 LAB — TSH: TSH: 4.88 u[IU]/mL — AB (ref 0.35–4.50)

## 2017-06-07 MED ORDER — LEVOTHYROXINE SODIUM 125 MCG PO TABS
125.0000 ug | ORAL_TABLET | Freq: Every day | ORAL | 1 refills | Status: DC
Start: 2017-06-07 — End: 2017-08-09

## 2017-06-07 NOTE — Telephone Encounter (Signed)
Please contact pt and let him know that TSH is showing that synthroid needs to be increased. Please confirm that he has been taking the dose for 1 month and if so, lets increase to PO once daily #30 with 1 refill.  Repeat TSH in 6 weeks.

## 2017-06-07 NOTE — Telephone Encounter (Signed)
Notified pt and he confirms that he has been taking medication consistently. New rx sent and lab appt scheduled for 07/19/17 and future lab order entered.

## 2017-06-20 ENCOUNTER — Encounter: Payer: Self-pay | Admitting: Gastroenterology

## 2017-07-04 ENCOUNTER — Other Ambulatory Visit: Payer: Self-pay | Admitting: Family

## 2017-07-15 ENCOUNTER — Encounter: Payer: Self-pay | Admitting: Family

## 2017-07-16 ENCOUNTER — Encounter: Payer: Self-pay | Admitting: Family

## 2017-07-19 ENCOUNTER — Other Ambulatory Visit: Payer: Self-pay

## 2017-08-09 ENCOUNTER — Other Ambulatory Visit: Payer: Self-pay | Admitting: Family

## 2017-08-12 ENCOUNTER — Other Ambulatory Visit (INDEPENDENT_AMBULATORY_CARE_PROVIDER_SITE_OTHER): Payer: Self-pay

## 2017-08-12 ENCOUNTER — Telehealth: Payer: Self-pay | Admitting: Family

## 2017-08-12 DIAGNOSIS — E038 Other specified hypothyroidism: Secondary | ICD-10-CM

## 2017-08-12 DIAGNOSIS — E039 Hypothyroidism, unspecified: Secondary | ICD-10-CM

## 2017-08-12 LAB — TSH: TSH: 1.19 u[IU]/mL (ref 0.35–4.50)

## 2017-08-12 NOTE — Telephone Encounter (Signed)
Copied from CRM [phone]. Topic: Quick Communication - See Telephone Encounter >> Aug 12, 2017  9:56 AM Valentina LucksMatos, Jackelin wrote: CRM for notification. See Telephone encounter for:  08/12/17.   error

## 2017-08-14 ENCOUNTER — Encounter: Payer: Self-pay | Admitting: Family

## 2017-10-24 ENCOUNTER — Other Ambulatory Visit: Payer: Self-pay | Admitting: Family

## 2017-10-25 NOTE — Telephone Encounter (Signed)
Levothyroxine refill sent to pharmacy. Pt is due now for 6 month follow up. Mychart message sent to pt.

## 2017-12-07 ENCOUNTER — Other Ambulatory Visit: Payer: Self-pay | Admitting: Family

## 2017-12-09 ENCOUNTER — Telehealth: Payer: Self-pay | Admitting: Family

## 2017-12-09 DIAGNOSIS — I350 Nonrheumatic aortic (valve) stenosis: Secondary | ICD-10-CM

## 2017-12-09 MED ORDER — SERTRALINE HCL 100 MG PO TABS: 100.0000 mg | ORAL_TABLET | Freq: Two times a day (BID) | ORAL | 0 refills | Status: DC

## 2017-12-09 NOTE — Telephone Encounter (Signed)
Pt states he is going out of town tomorrow and needs this med   sertraline (ZOLOFT) 100 MG tablet  [facility] [address] - [place] ([place]), [place] - [address] [phone] (Phone) [phone] (Fax)

## 2017-12-09 NOTE — Telephone Encounter (Signed)
Copied from CRM [id number]. 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

## 2017-12-09 NOTE — Telephone Encounter (Signed)
See 12/09/17 phone note.

## 2017-12-09 NOTE — Telephone Encounter (Signed)
Spoke with pt. There is a refill from pt's pharmacy that came to us dated 12/07/17. The office was closed so refill was just addressed today. Refill sent.  Pt leaving out of town tomorrow and will not be back until June. Pt states he cannot remember his mychart login or he would have communicated with us that way. Pt states he doesn't understand why he can't get this refill to line up with his other medication. Upon chart review, refills were given for up to 6 months at last refill. Pt was due for a 6 month follow up in March and has not scheduled appt. Advised pt that he would need to discuss with pharmacy how to get his refills to line up on the same date. We send refills as they are requested by the patient / pharmacy. Pt states he can't remember to request refills early enough so he does not run out of meds. Advised him to set reminder on his phone at least 1 week before med runs out or have pharmacy place him on auto refill. Scheduled pt appointment for 01/27/18 at 11:20am. Pt also states he saw Dr Eldridge DaceVaranasi (cardiology) last year and had an echo done in the past. Pt thinks it is time to have the echo repeated and would like PCP to order this.  Please advise?

## 2017-12-10 NOTE — Telephone Encounter (Signed)
Notified pt of below. He is currently driving and unable to write down contact #s for Dr's BrazilVaranasi and Th[employer] Financial[employer] Trigt.  Advised pt I would call him back and leave #s on his voicemail. #s left for Dr Eldridge DaceVaranasi [phone]) and Dr Donata ClayVan Trigt [phone]) for pt to call and arrange follow ups with them.

## 2017-12-10 NOTE — Telephone Encounter (Signed)
I placed echo referral, but he is past due for follow up with cardiology (varanasi) and should schedule follow up. He also is due to schedule follow up with Dr. Morton PetersVan Tright for follow up of his aneurysm.

## 2017-12-11 ENCOUNTER — Other Ambulatory Visit: Payer: Self-pay | Admitting: Cardiothoracic Surgery

## 2017-12-11 DIAGNOSIS — I712 Thoracic aortic aneurysm, without rupture, unspecified: Secondary | ICD-10-CM

## 2017-12-21 IMAGING — CT CT ANGIO CHEST
4 of 9 series · 18 of 36 positions shown · IV contrast (ISOVUE 370)
Comparison: None.

CLINICAL DATA: 60-year-old male with history of thoracic aortic
aneurysm. Followup study.

EXAM:
CT ANGIOGRAPHY CHEST WITH CONTRAST
TECHNIQUE: Multidetector CT imaging of the chest was performed using the
standard protocol during bolus administration of intravenous
contrast. Multiplanar CT image reconstructions and MIPs were
obtained to evaluate the vascular anatomy.
CONTRAST:  100 mL of Isovue 370.

[Series 5: thins · axial · 0.80mm/px · z∈[-396,-149]mm · 12 of 293 slices shown]
[im 23/293  lung]
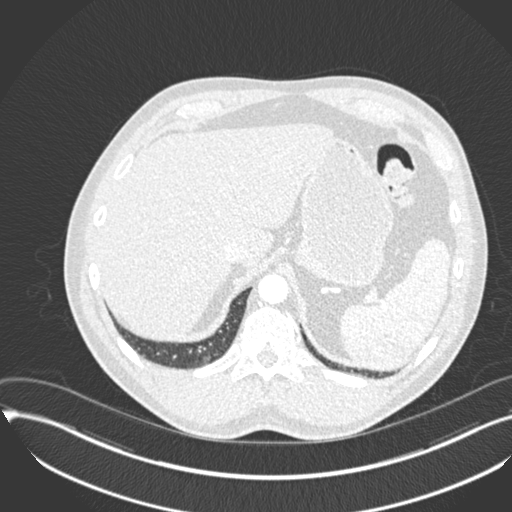
[im 45/293  mediastinal]
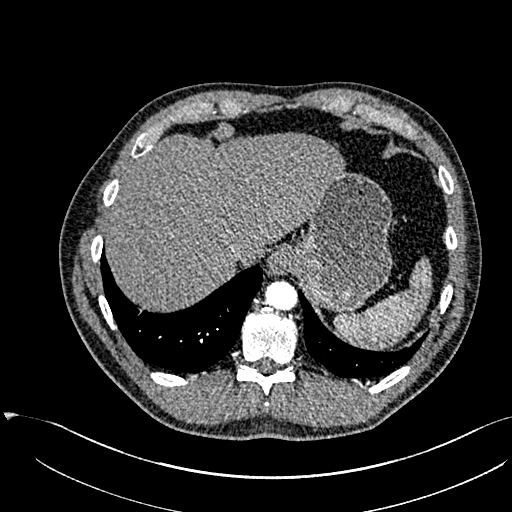
[im 68/293  lung]
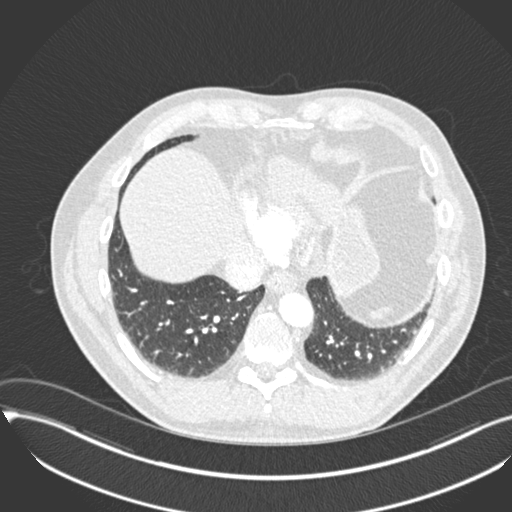
[im 90/293  mediastinal]
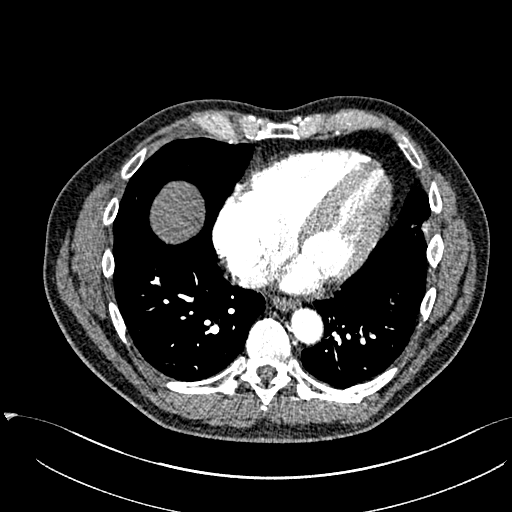
[im 113/293  lung]
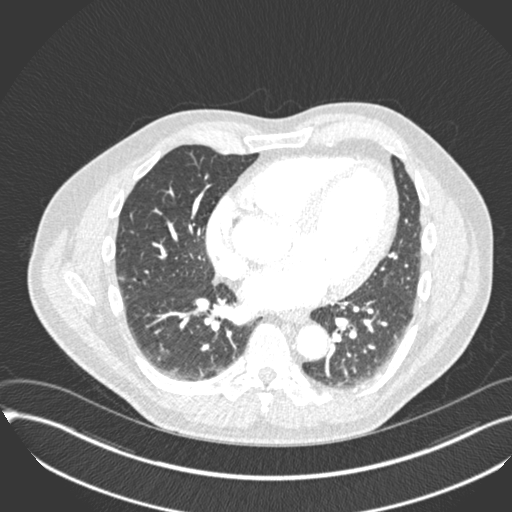
[im 135/293  mediastinal]
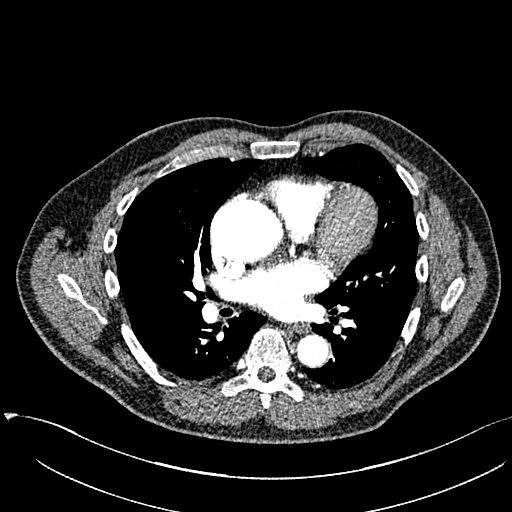
[im 158/293  lung]
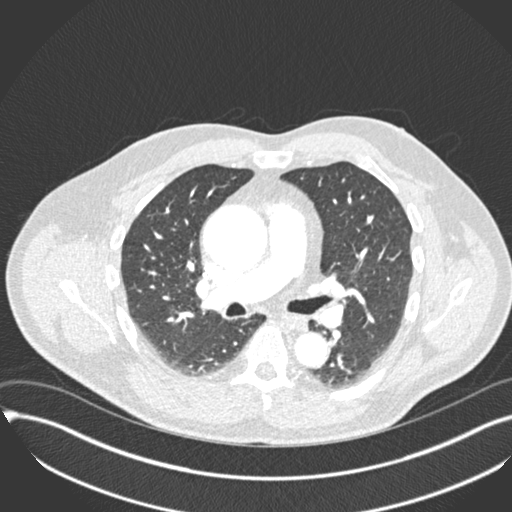
[im 180/293  mediastinal]
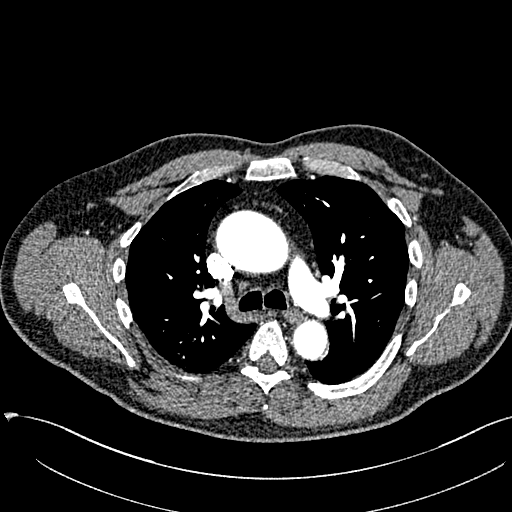
[im 203/293  lung]
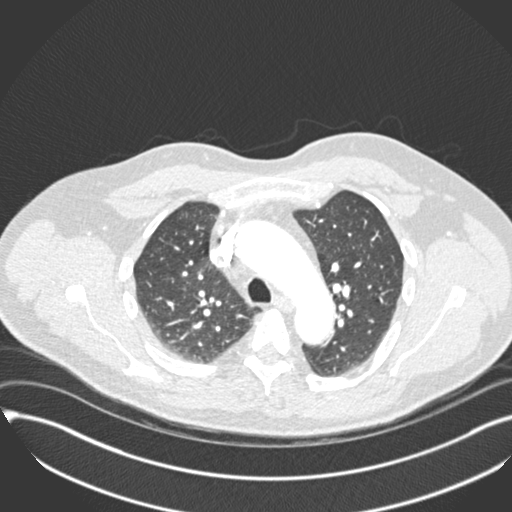
[im 225/293  mediastinal]
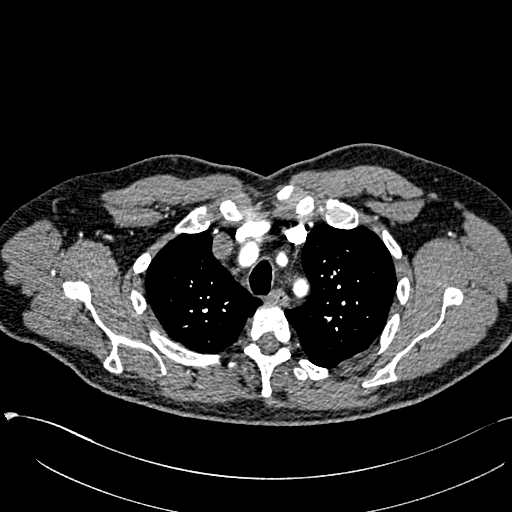
[im 248/293  lung]
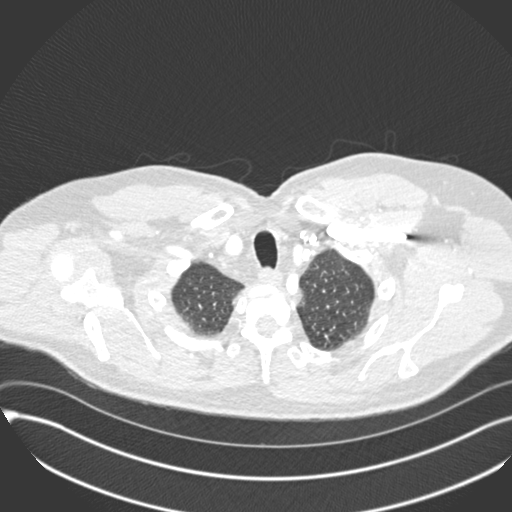
[im 270/293  mediastinal]
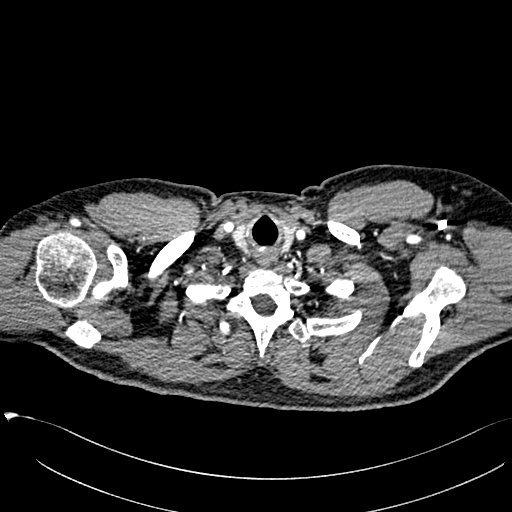

[Series 6: lung · axial · 0.80mm/px · z∈[-296,-218]mm · 2 of 78 slices shown]
[im 26/78  mediastinal]
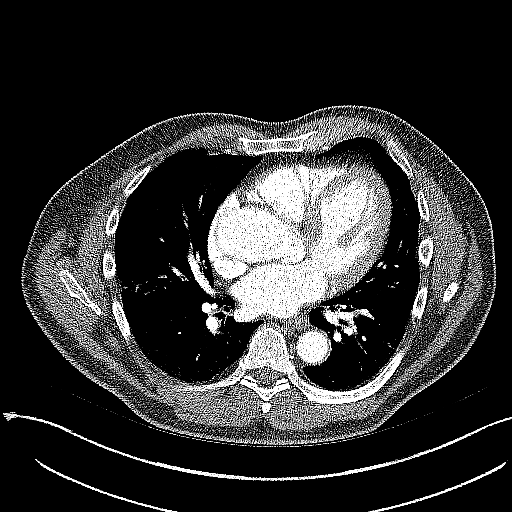
[im 52/78  mediastinal]
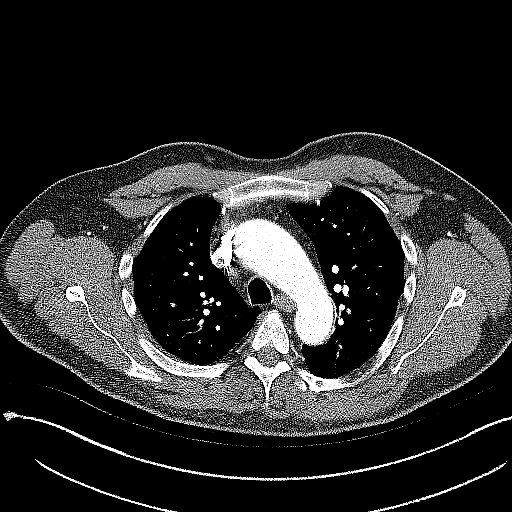

[Series 7: coronal mpr · coronal · 0.59mm/px · 1 of 151 slices shown]
[im 76/151  mediastinal]
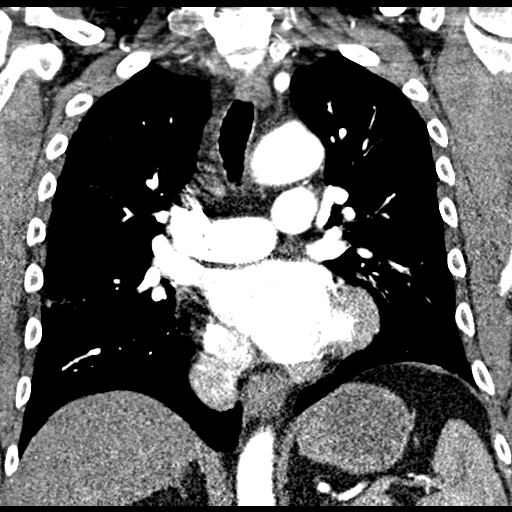

[Series 11: lung recon 2 · axial · 0.80mm/px · z∈[-347,-209]mm · 3 of 93 slices shown]
[im 24/93  mediastinal]
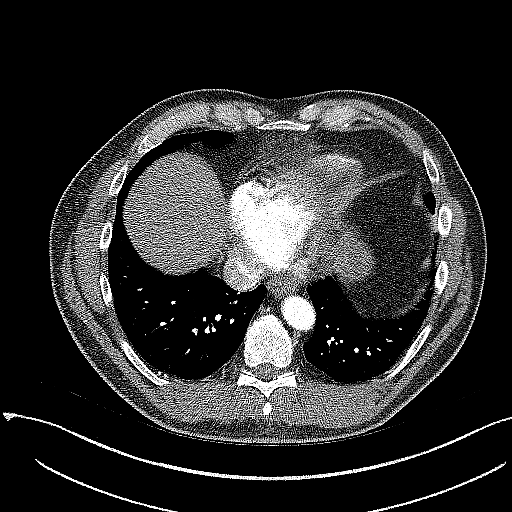
[im 47/93  mediastinal]
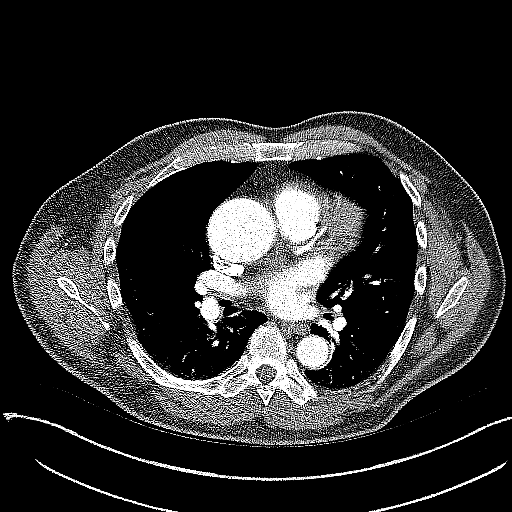
[im 70/93  mediastinal]
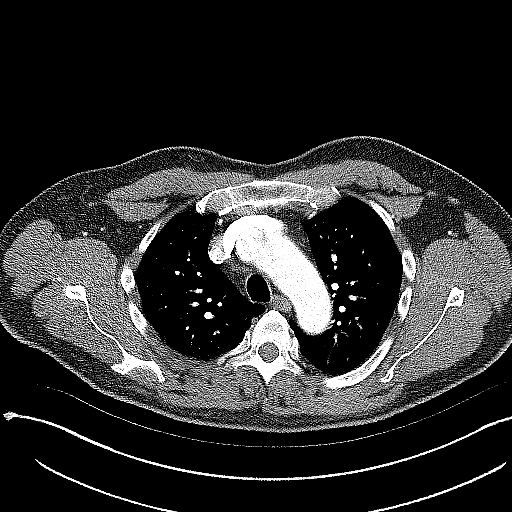

[18 of 36 positions shown; findings below may reference images not displayed]

FINDINGS: Cardiovascular: Heart size is normal. There is no significant
pericardial fluid, thickening or pericardial calcification. Severe
thickening calcification of the aortic valve. Aneurysmal dilatation
of the ascending thoracic aorta which measures up to 5.1 cm in
diameter. Mid aortic arch and descending thoracic aorta measure
cm in diameter and 2.5 cm in diameter respectively. No evidence of
thoracic aortic dissection.

Mediastinum/Nodes: No pathologically enlarged mediastinal or hilar
lymph nodes. Esophagus is unremarkable in appearance. No axillary
lymphadenopathy.

Lungs/Pleura: Small clusters of peribronchovascular ground-glass
attenuation are noted in the lateral segment of the right middle
lobe, and in the right lower lobe, presumably infectious or
inflammatory in etiology. In addition, in the inferior segment of
the lingula there is a 1.2 x 2.5 cm nodular appearing area of
architectural distortion (image 66 of series 6).

Upper Abdomen: Unremarkable.

Musculoskeletal: There are no aggressive appearing lytic or blastic
lesions noted in the visualized portions of the skeleton. Multiple
old healed left-sided rib fractures are noted.

Review of the MIP images confirms the above findings.
IMPRESSION: 1. Ascending thoracic aortic aneurysm measuring 5.1 cm in diameter.
Ascending thoracic aortic aneurysm. Recommend semi-annual imaging
followup by CTA or MRA and referral to cardiothoracic surgery if not
already obtained. This recommendation follows 2707
ACCF/AHA/AATS/ACR/ASA/SCA/GARHAM/SOLBERG/EIMAN/KOUKA Guidelines for the
Diagnosis and Management of Patients With Thoracic Aortic Disease.
Circulation. 2707; 121: e266-e369.
2. There is severe thickening and calcification of the aortic valve.
Echocardiographic correlation for evaluation of potential valvular
dysfunction may be warranted if clinically indicated.
3. Nodular appearing area of architectural distortion in the
inferior segment of the lingula measuring 12 x 25 mm. This could
simply represent an area of post infectious or inflammatory
scarring, however, underlying neoplasm is not excluded. Consider one
of the following in 3 months for both low-risk and high-risk
individuals: (a) repeat chest CT, (b) follow-up PET-CT, or (c)
tissue sampling. This recommendation follows the consensus
statement: Guidelines for Management of Incidental Pulmonary Nodules
Detected on CT Images: From the [HOSPITAL] 2687; Radiology
2687; [DATE].
4. Clustered areas of peribronchovascular ground-glass attenuation
in the periphery of the right lower lobe and lateral segment of the
right middle lobe, favored to be infectious or inflammatory in
etiology.

## 2018-01-07 ENCOUNTER — Telehealth: Payer: Self-pay | Admitting: *Deleted

## 2018-01-07 NOTE — Telephone Encounter (Signed)
Melissa-- Pt has follow up appt with you on 01/27/18. Do you want [place] to schedule the lab visit prior to his visit?  Copied from CRM [id number]. Topic: Quick Communication - See Telephone Encounter >> Jan 07, 2018 11:33 AM Mare Loan F wrote: Pt is wanting to ask osullivan if he needs to schedule for blood screening for his meds on the same day as he echocardiogram on may 23   Best number [phone] ok to leave a message

## 2018-01-08 ENCOUNTER — Encounter: Payer: Self-pay | Admitting: Family

## 2018-01-08 DIAGNOSIS — E039 Hypothyroidism, unspecified: Secondary | ICD-10-CM

## 2018-01-08 DIAGNOSIS — E785 Hyperlipidemia, unspecified: Secondary | ICD-10-CM

## 2018-01-08 DIAGNOSIS — I10 Essential (primary) hypertension: Secondary | ICD-10-CM

## 2018-01-08 NOTE — Telephone Encounter (Signed)
Please contact patient to schedule a fasting lab appointment.  I have placed labs.

## 2018-01-08 NOTE — Telephone Encounter (Signed)
See additional phone note. 

## 2018-01-08 NOTE — Telephone Encounter (Signed)
Scheduled lab appt for 01/09/18 at 9:45am and notified pt. He requests that we schedule his appointment with Dr Eldridge Dace for 01/27/18 (same day as f/u with PCP). Advised pt he will need to call Dr Eldridge Dace to schedule appt but pt states he doesn't have his phone #. He requests that I email it to him in [place]. Information sent. Pt then requests that we forward labs and echo results to Dr Eldridge Dace as well. We will fax labs once completed and PCP has reviewed them. Advised pt to request radiology to send Echo result to Dr Eldridge Dace when he goes for testing in the morning. Pt voices understanding.

## 2018-01-09 ENCOUNTER — Ambulatory Visit (HOSPITAL_BASED_OUTPATIENT_CLINIC_OR_DEPARTMENT_OTHER)
Admission: RE | Admit: 2018-01-09 | Discharge: 2018-01-09 | Disposition: A | Discharge status: Home / Self Care | Payer: Self-pay | Source: Ambulatory Visit | Attending: Family | Admitting: Family

## 2018-01-09 ENCOUNTER — Other Ambulatory Visit (INDEPENDENT_AMBULATORY_CARE_PROVIDER_SITE_OTHER): Payer: Self-pay

## 2018-01-09 DIAGNOSIS — E039 Hypothyroidism, unspecified: Secondary | ICD-10-CM

## 2018-01-09 DIAGNOSIS — I10 Essential (primary) hypertension: Secondary | ICD-10-CM

## 2018-01-09 DIAGNOSIS — I080 Rheumatic disorders of both mitral and aortic valves: Secondary | ICD-10-CM | POA: Insufficient documentation

## 2018-01-09 DIAGNOSIS — E785 Hyperlipidemia, unspecified: Secondary | ICD-10-CM | POA: Insufficient documentation

## 2018-01-09 DIAGNOSIS — I350 Nonrheumatic aortic (valve) stenosis: Secondary | ICD-10-CM

## 2018-01-09 LAB — LIPID PANEL
CHOLESTEROL: 165 mg/dL (ref 0–200)
HDL: 49.1 mg/dL (ref >39.00)
LDL CALC: 84 mg/dL (ref 0–99)
NonHDL: 115.48
TRIGLYCERIDES: 159 mg/dL — AB (ref 0.0–149.0)
Total CHOL/HDL Ratio: 3
VLDL: 31.8 mg/dL (ref 0.0–40.0)

## 2018-01-09 LAB — HEPATIC FUNCTION PANEL
ALK PHOS: 62 U/L (ref 39–117)
ALT: 20 U/L (ref 0–53)
AST: 19 U/L (ref 0–37)
Albumin: 4.4 g/dL (ref 3.5–5.2)
BILIRUBIN DIRECT: 0.1 mg/dL (ref 0.0–0.3)
Total Bilirubin: 0.4 mg/dL (ref 0.2–1.2)
Total Protein: 7.3 g/dL (ref 6.0–8.3)

## 2018-01-09 LAB — BASIC METABOLIC PANEL
BUN: 17 mg/dL (ref 6–23)
CALCIUM: 9.7 mg/dL (ref 8.4–10.5)
CO2: 30 mEq/L (ref 19–32)
CREATININE: 1.18 mg/dL (ref 0.40–1.50)
Chloride: 102 mEq/L (ref 96–112)
GFR: 66.3 mL/min (ref >60.00)
GLUCOSE: 102 mg/dL — AB (ref 70–99)
POTASSIUM: 4.3 meq/L (ref 3.5–5.1)
Sodium: 139 mEq/L (ref 135–145)

## 2018-01-09 LAB — TSH: TSH: 6.08 u[IU]/mL — AB (ref 0.35–4.50)

## 2018-01-09 NOTE — Progress Notes (Signed)
  Echocardiogram 2D Echocardiogram has been performed.  Dorothey Baseman 01/09/2018, 10:28 AM

## 2018-01-10 ENCOUNTER — Telehealth: Payer: Self-pay | Admitting: Family

## 2018-01-10 ENCOUNTER — Other Ambulatory Visit: Payer: Self-pay

## 2018-01-10 DIAGNOSIS — E039 Hypothyroidism, unspecified: Secondary | ICD-10-CM

## 2018-01-10 MED ORDER — LEVOTHYROXINE SODIUM 137 MCG PO TABS: 137.0000 ug | ORAL_TABLET | Freq: Every day | ORAL | 1 refills | Status: DC

## 2018-01-10 NOTE — Telephone Encounter (Signed)
Results given to patient, new rx for Synthroid 137 mcg sent to his local pharmacy, order for tsh in 3 moths entered as future.   Advised of diet for his triglycerides.   He reports he is scheduled for Ct on May 29th per TS Dr. Donata Clay.

## 2018-01-10 NOTE — Telephone Encounter (Signed)
Please let pt know that synthroid needs to be increased from 125 mcg to 137 mcg/day.  Repeat tsh in 6 weeks, dx hypothyroid.   Triglycerides remain mildly elevated. Please work on avoiding concentrated sweets, and limiting white carbs (rice/bread/pasta/potatoes). Instead substitute whole grain versions with reasonable portions.  Also, he should repeat CT of the chest in the end of June- CT that was done by Dr. Eldridge Dace I reviewed and there is a spot on his lungs that we need to watch.

## 2018-01-14 NOTE — Telephone Encounter (Signed)
Dr Eldridge Dace is on Epic and should be able to pull up labs done by [place]. Email sent to pt advising him on that.

## 2018-01-15 ENCOUNTER — Ambulatory Visit
Admission: RE | Admit: 2018-01-15 | Discharge: 2018-01-15 | Disposition: A | Discharge status: Home / Self Care | Payer: No Typology Code available for payment source | Source: Ambulatory Visit | Attending: Cardiothoracic Surgery | Admitting: Cardiothoracic Surgery

## 2018-01-15 ENCOUNTER — Encounter: Payer: Self-pay | Admitting: Cardiothoracic Surgery

## 2018-01-15 ENCOUNTER — Other Ambulatory Visit: Payer: Self-pay

## 2018-01-15 ENCOUNTER — Ambulatory Visit (INDEPENDENT_AMBULATORY_CARE_PROVIDER_SITE_OTHER): Payer: Self-pay | Admitting: Cardiothoracic Surgery

## 2018-01-15 VITALS — BP 126/86 | HR 87 | Resp 16 | Ht 70.0 in | Wt 210.0 lb

## 2018-01-15 DIAGNOSIS — I712 Thoracic aortic aneurysm, without rupture, unspecified: Secondary | ICD-10-CM

## 2018-01-15 DIAGNOSIS — I350 Nonrheumatic aortic (valve) stenosis: Secondary | ICD-10-CM

## 2018-01-15 DIAGNOSIS — I341 Nonrheumatic mitral (valve) prolapse: Secondary | ICD-10-CM

## 2018-01-15 MED ORDER — IOPAMIDOL (ISOVUE-370) INJECTION 76%
75.0000 mL | Freq: Once | INTRAVENOUS | Status: AC | PRN
  Administered 2018-01-15: 75 mL via INTRAVENOUS

## 2018-01-15 NOTE — Progress Notes (Signed)
PCP is Sandford Craze, NP Referring Provider is Corky Crafts, MD  Chief Complaint  Patient presents with  . TAA    1 yr f/u with CTA CHEST, ECHO 01/09/18    HPI: The patient returns for one-year scheduled follow-up visit with CTA of thoracic aorta.  Patient also had a follow-up echocardiogram by his cardiologist, Dr. Eldridge Dace.  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  On the most recent exam it appears slightly larger at 5.1-5.2 cm.  Patient also has aortic stenosis of a probable tricuspid aortic valve.  Calculated peak gradient has increased to 44 mmHg up from 40 mmHg last year.  The aortic stenosis remains in the moderate range of disease but is approaching severe aortic stenosis.  Patient has had no symptoms associated with the progression of disease noted on echo and CT scan.  He enjoys physical exercise- walking, swimming, and biking.  In his earlier years he was a devoted triathlete.  He has noted no change in exercise tolerance or chest pain, dizziness, presyncope, or shortness of breath during his usual aerobic activities.  He did receive a Proventil inhaler from his physician which he uses with success for relief of chest tightness with exercise.  We had an in-depth discussion regarding the risks of aortic stenosis and ascending thoracic aneurysm and how sudden death or dissection may be the first symptom associated with the pathology.  He  is adamant not to have major surgery unless his body is telling him that something is wrong.  We discussed his significant dental disease with broken necrotic teeth.  He understands importance of having his dental disease treated before valve replacement surgery or graft replacement of his ascending aorta.  He also understands that he would need a cardiac catheterization prior to having surgery for combined AVR-ascending aortic replacement.     Past  Medical History:  Diagnosis Date  . Aortic stenosis   . Hypertension   . Mitral valve prolapse 02/24/2014    Past Surgical History:  Procedure Laterality Date  . ARTHROSCOPIC REPAIR ACL Left 1996  . ORIF HUMERUS FRACTURE Right 1978    Family History  Problem Relation Age of Onset  . Anxiety disorder Mother   . Arrhythmia Mother        "died in her sleep" ? MI  . Alzheimer's disease Father   . Obesity Sister   . Prostate cancer Brother     Social History Social History   Tobacco Use  . Smoking status: Never Smoker  . Smokeless tobacco: Never Used  Substance Use Topics  . Alcohol use: No  . Drug use: No    Current Outpatient Medications  Medication Sig Dispense Refill  . albuterol (PROVENTIL HFA;VENTOLIN HFA) 108 (90 Base) MCG/ACT inhaler Inhale 2 puffs into the lungs every 6 (six) hours as needed for wheezing or shortness of breath. 1 Inhaler 5  . atorvastatin (LIPITOR) 20 MG tablet Take 1 tablet (20 mg total) by mouth daily. 30 tablet 5  . beclomethasone (QVAR REDIHALER) 80 MCG/ACT inhaler Inhale 1 puff into the lungs 2 (two) times daily. 1 Inhaler 5  . levothyroxine (SYNTHROID) 137 MCG tablet Take 1 tablet (137 mcg total) by mouth daily before breakfast. 90 tablet 1  . losartan-hydrochlorothiazide (HYZAAR) 50-12.5 MG tablet TAKE 1 TABLET BY MOUTH ONCE DAILY 90 tablet 1  . montelukast (SINGULAIR) 10 MG tablet TAKE 1 TABLET BY MOUTH AT BEDTIME 90 tablet  1  . sertraline (ZOLOFT) 100 MG tablet Take 1 tablet (100 mg total) by mouth 2 (two) times daily. 180 tablet 0   No current facility-administered medications for this visit.     Allergies  Allergen Reactions  . Cleocin [Clindamycin Hcl] Nausea And Vomiting  . 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 No edema, orthopnea, PND No dizziness, presyncope, orthostatic symptoms No blood per rectum nausea No chest or upper back pain  BP 126/86 (BP Location: Right  Arm, Patient Position: Sitting, Cuff Size: Large)   Pulse 87   Resp 16   Ht  (1.778 m)   Wt 210 lb (95.3 kg)   SpO2 98% Comment: ON RA  BMI 30.13 kg/m  Physical Exam      Exam    General- alert and comfortable    Neck- no JVD, no cervical adenopathy palpable, no carotid bruit   Lungs- clear without rales, wheezes   Cor- regular rate and rhythm, 2-3/6 systolic ejection murmur, no gallop   Abdomen- soft, non-tender   Extremities - warm, non-tender, minimal edema   Neuro- oriented, appropriate, no focal weakness   Diagnostic Tests: CTA images personally reviewed.  The ascending aorta appears to be somewhat enlarged from last year now 5.1-5.2 cm.  Echocardiogram images personally reviewed and discussed with patient.  Aortic stenosis appears to be increased with calculated gradient now 44 mmHg with a valve area of 0.9  Impression: Significant aortic stenosis, significant ascending aortic fusiform aneurysm both with risks of sudden death, probably approaching the risk of surgery.  However the patient is not willing to consider surgery at this point.  He has agreed to not increase  the severity evel of his various aerobic exercise workouts. I clearly recommended against him starting a running program which he was considering.  Plan: I will follow the patient at 52-month intervals now.  I recommend he have echocardiograms performed every 6 months.  I have referred the patient to the dental clinic to have his dental disease treated now rather than waiting until there may be a urgent timing factor for surgery. Continue current medications.  Mikey Bussing, MD [facility] [phone]

## 2018-01-16 ENCOUNTER — Telehealth: Payer: Self-pay | Admitting: Family

## 2018-01-20 NOTE — Telephone Encounter (Signed)
Opened in error

## 2018-01-23 ENCOUNTER — Telehealth: Payer: Self-pay | Admitting: Family

## 2018-01-23 NOTE — Telephone Encounter (Signed)
Copied from CRM [id number]. Topic: Quick Communication - See Telephone Encounter >> Jan 23, 2018 10:55 AM Ninfa MeekerPoole, Bridgett H wrote: CRM for notification. See Telephone encounter for: 01/23/18.  Left voicemail pt appt needs to be moved to different time or changed to another day per pcp.

## 2018-01-24 ENCOUNTER — Other Ambulatory Visit: Payer: Self-pay | Admitting: Family

## 2018-01-24 NOTE — Telephone Encounter (Signed)
Pt did lab appt in May but did not keep follow up with you that was scheduled for June. Please advise refill request?

## 2018-01-25 NOTE — Telephone Encounter (Signed)
Rx sent for 14 day supply. Needs OV prior to additional refills.

## 2018-01-27 ENCOUNTER — Ambulatory Visit: Payer: Self-pay | Admitting: Family

## 2018-01-31 ENCOUNTER — Telehealth: Payer: Self-pay

## 2018-01-31 DIAGNOSIS — E785 Hyperlipidemia, unspecified: Secondary | ICD-10-CM

## 2018-01-31 MED ORDER — ATORVASTATIN CALCIUM 20 MG PO TABS: 20.0000 mg | ORAL_TABLET | Freq: Every day | ORAL | 0 refills | Status: DC

## 2018-01-31 NOTE — Telephone Encounter (Signed)
Pt. phoned [facility] upset about quantity of atorvastatin provided from last refill, as pt. Is on the road and needs a greater quantity supplied. Pt. stated his last provider appointment for 6/17 was cancelled by the office, not him. Author filled atorvastatin X 90 days as has been recommended in the past, and made appointment for 7/1 at 1100 with Sandford CrazeMelissa O'Sullivan, NP.

## 2018-02-03 ENCOUNTER — Ambulatory Visit: Payer: Self-pay | Admitting: Family

## 2018-02-17 ENCOUNTER — Ambulatory Visit (INDEPENDENT_AMBULATORY_CARE_PROVIDER_SITE_OTHER): Payer: Self-pay | Admitting: Family

## 2018-02-17 ENCOUNTER — Encounter: Payer: Self-pay | Admitting: Family

## 2018-02-17 VITALS — BP 121/87 | HR 96 | Temp 98.2°F | Resp 16 | Ht 70.0 in | Wt 208.0 lb

## 2018-02-17 DIAGNOSIS — E039 Hypothyroidism, unspecified: Secondary | ICD-10-CM

## 2018-02-17 DIAGNOSIS — E785 Hyperlipidemia, unspecified: Secondary | ICD-10-CM

## 2018-02-17 DIAGNOSIS — Z1159 Encounter for screening for other viral diseases: Secondary | ICD-10-CM

## 2018-02-17 DIAGNOSIS — I712 Thoracic aortic aneurysm, without rupture, unspecified: Secondary | ICD-10-CM

## 2018-02-17 DIAGNOSIS — R109 Unspecified abdominal pain: Secondary | ICD-10-CM

## 2018-02-17 DIAGNOSIS — F419 Anxiety disorder, unspecified: Secondary | ICD-10-CM

## 2018-02-17 LAB — TSH: TSH: 1.26 u[IU]/mL (ref 0.35–4.50)

## 2018-02-17 LAB — URINALYSIS, ROUTINE W REFLEX MICROSCOPIC
Bilirubin Urine: NEGATIVE
Hgb urine dipstick: NEGATIVE
KETONES UR: NEGATIVE
LEUKOCYTES UA: NEGATIVE
Nitrite: NEGATIVE
RBC / HPF: NONE SEEN (ref 0–?)
SPECIFIC GRAVITY, URINE: 1.01 (ref 1.000–1.030)
Total Protein, Urine: NEGATIVE
URINE GLUCOSE: NEGATIVE
UROBILINOGEN UA: 0.2 (ref 0.0–1.0)
pH: 6 (ref 5.0–8.0)

## 2018-02-17 MED ORDER — MELOXICAM 7.5 MG PO TABS: 7.5000 mg | ORAL_TABLET | Freq: Every day | ORAL | 0 refills | Status: DC

## 2018-02-17 MED ORDER — MONTELUKAST SODIUM 10 MG PO TABS: 10.0000 mg | ORAL_TABLET | Freq: Every day | ORAL | 1 refills | Status: DC

## 2018-02-17 MED ORDER — BECLOMETHASONE DIPROP HFA 80 MCG/ACT IN AERB: 1.0000 | INHALATION_SPRAY | Freq: Two times a day (BID) | RESPIRATORY_TRACT | 5 refills | Status: DC

## 2018-02-17 MED ORDER — LOSARTAN POTASSIUM-HCTZ 50-12.5 MG PO TABS: 1.0000 | ORAL_TABLET | Freq: Every day | ORAL | 1 refills | Status: DC

## 2018-02-17 MED ORDER — ALBUTEROL SULFATE HFA 108 (90 BASE) MCG/ACT IN AERS
2.0000 | INHALATION_SPRAY | Freq: Four times a day (QID) | RESPIRATORY_TRACT | 5 refills | Status: DC | PRN
Start: 2018-02-17 — End: 2018-09-03

## 2018-02-17 MED ORDER — SERTRALINE HCL 100 MG PO TABS: 100.0000 mg | ORAL_TABLET | Freq: Two times a day (BID) | ORAL | 0 refills | Status: DC

## 2018-02-17 MED ORDER — ATORVASTATIN CALCIUM 20 MG PO TABS: 20.0000 mg | ORAL_TABLET | Freq: Every day | ORAL | 0 refills | Status: DC

## 2018-02-17 NOTE — Patient Instructions (Addendum)
Please complete lab work prior to leaving. Schedule a complete physical at your convenience.  

## 2018-02-17 NOTE — Progress Notes (Signed)
Subjective:    Patient ID: Robert Hobbs, male    DOB: March 16, 1956, 62 y.o.   MRN: [id number]  HPI  Patient presents today for follow up.  1) hyperlipidemia- maintained on lipitor 20mg .   Lab Results  Component Value Date   CHOL 165 01/09/2018   HDL 49.10 01/09/2018   LDLCALC 84 01/09/2018   TRIG 159.0 (H) 01/09/2018   CHOLHDL 3 01/09/2018   2) HTN- maintained on hyzaar BP Readings from Last 3 Encounters:  02/17/18 121/87  01/15/18 126/86  04/23/17 125/77   3) Flank pain- reports + flank pain since last Wednesday- denies dysuria/frequency/blood.   4) Anxiety- maintained on zoloft. Reports that he has issues with anxiety if he stops- gets aggitated.   5) Ascending Aortic Aneurysm/AS- saw Dr. Donata ClayVan Trigt.  Noted that his aneurysm appeared slightly larger at 5.1-5.2 and AS moderate to severe range. He recommended surgical repair but the patient declines.   7) Asthma- reports that asthma has been under better control with qvar- has been  Using every other day.  Continues singulair daily.    Hypothyroid-  Lab Results  Component Value Date   TSH 6.08 (H) 01/09/2018   Wt Readings from Last 3 Encounters:  02/17/18 208 lb (94.3 kg)  01/15/18 210 lb (95.3 kg)  04/23/17 202 lb (91.6 kg)      Review of Systems See HPI  Past Medical History:  Diagnosis Date  . Aortic stenosis   . Hypertension   . Mitral valve prolapse 02/24/2014     Social History   Socioeconomic History  . Marital status: Married    Spouse name: Not on file  . Number of children: Not on file  . Years of education: Not on file  . Highest education level: Not on file  Occupational History  . Not on file  Social Needs  . Financial resource strain: Not on file  . Food insecurity:    Worry: Not on file    Inability: Not on file  . Transportation needs:    Medical: Not on file    Non-medical: Not on file  Tobacco Use  . Smoking status: Never Smoker  . Smokeless tobacco: Never Used  Substance  and Sexual Activity  . Alcohol use: No  . Drug use: No  . Sexual activity: Not on file  Lifestyle  . Physical activity:    Days per week: Not on file    Minutes per session: Not on file  . Stress: Not on file  Relationships  . Social connections:    Talks on phone: Not on file    Gets together: Not on file    Attends religious service: Not on file    Active member of club or organization: Not on file    Attends meetings of clubs or organizations: Not on file    Relationship status: Not on file  . Intimate partner violence:    Fear of current or ex partner: Not on file    Emotionally abused: Not on file    Physically abused: Not on file    Forced sexual activity: Not on file  Other Topics Concern  . Not on file  Social History Narrative   He is separated   He has a 434 year old  (daughter) and 63 yr old (daughter) 63 yr old (daughter) and 123 year (son) 10949 year old daughter   Long distance truck driver   Enjoys biking but does not have much time to do  that    Past Surgical History:  Procedure Laterality Date  . ARTHROSCOPIC REPAIR ACL Left 1996  . ORIF HUMERUS FRACTURE Right 1978    Family History  Problem Relation Age of Onset  . Anxiety disorder Mother   . Arrhythmia Mother        "died in her sleep" ? MI  . Alzheimer's disease Father   . Obesity Sister   . Prostate cancer Brother     Allergies  Allergen Reactions  . Cleocin [Clindamycin Hcl] Nausea And Vomiting  . Shellfish Allergy     Current Outpatient Medications on File Prior to Visit  Medication Sig Dispense Refill  . levothyroxine (SYNTHROID) 137 MCG tablet Take 1 tablet (137 mcg total) by mouth daily before breakfast. 90 tablet 1   No current facility-administered medications on file prior to visit.     BP 121/87 (BP Location: Right Arm, Patient Position: Sitting, Cuff Size: Small)   Pulse 96   Temp 98.2 F (36.8 C) (Oral)   Resp 16   Ht 5\' 10"  (1.778 m)   Wt 208 lb (94.3 kg)   SpO2 100%    BMI 29.84 kg/m       Objective:   Physical Exam  Constitutional: He is oriented to person, place, and time. He appears well-developed and well-nourished. No distress.  HENT:  Head: Normocephalic and atraumatic.  Cardiovascular: Normal rate and regular rhythm.  No murmur heard. Pulmonary/Chest: Effort normal and breath sounds normal. No respiratory distress. He has no wheezes. He has no rales.  Musculoskeletal: He exhibits no edema.  Neurological: He is alert and oriented to person, place, and time.  Skin: Skin is warm and dry.  Psychiatric: He has a normal mood and affect. His behavior is normal. Thought content normal.          Assessment & Plan:  AAA- discussed risk of rupture/death.  Pt states he understands this but is unwilling to proceed with surgery at this time.  Hyperlipidemia- stable. tolerating statin, continue same. Lab Results  Component Value Date   CHOL 165 01/09/2018   HDL 49.10 01/09/2018   LDLCALC 84 01/09/2018   TRIG 159.0 (H) 01/09/2018   CHOLHDL 3 01/09/2018   HTN- bp stable, continue hyzaar.   Asthma- stable with qvar/sinugulair and prn albuterol.   Anxiety- stable on zoloft.   Left sided flank pain- likely musculoskeletal. Rx with meloxicam, check UA.

## 2018-02-19 LAB — HEPATITIS C ANTIBODY
HEP C AB: NONREACTIVE
SIGNAL TO CUT-OFF: 0.01 (ref <1.00)

## 2018-03-13 ENCOUNTER — Other Ambulatory Visit: Payer: Self-pay | Admitting: Family

## 2018-03-13 ENCOUNTER — Telehealth: Payer: Self-pay | Admitting: Family

## 2018-03-13 NOTE — Telephone Encounter (Signed)
Copied from CRM [id number]. Topic: General - Other >> Mar 13, 2018  2:18 PM Gaynelle AduPoole, Shalonda wrote: Reason for CRM: patient is calling to advise the medication  losartan-hydrochlorothiazide (HYZAAR) 50-12.5 MG tablet  is on back order and he only has 5 pills left, he is wanting to have a generic brand to replace this medication. Please advise

## 2018-03-13 NOTE — Telephone Encounter (Signed)
Copied from CRM [id number]. Topic: Quick Communication - See Telephone Encounter >> Mar 13, 2018 10:45 AM Windy KalataMichael, Nelwyn Hebdon L, NT wrote: CRM for notification. See Telephone encounter for: 03/13/18.  Patient is calling and is very aggravated that his pharmacy has not filled his medicine that was sent in on July 1st. Patient is wanting all of his medications that were sent on 02/17/18 [facility] He states he did not pick up any prescriptions because they told him he did not have them. Please contact patient once this has been done.   [id number] [place], [place] - [address] [place] [id number] Phone: [phone] Fax: [phone]

## 2018-03-17 NOTE — Telephone Encounter (Signed)
Spoke with pharmacy, Med comes in 100/25mg  strength that is not on backorder at this time however, pt was able to pick up current strength at another location. Advised pt that in the future if current strength is still on back order that we may be able to work around dose without changing the medication completely and pt voices understanding.

## 2018-04-04 NOTE — Telephone Encounter (Signed)
Patient got all of his prescriptions, he wanted [facility] [address] [facility]. Pharmacy information updated.

## 2018-04-11 ENCOUNTER — Ambulatory Visit: Payer: Self-pay | Admitting: Interventional Cardiology

## 2018-06-05 ENCOUNTER — Other Ambulatory Visit: Payer: Self-pay | Admitting: Family

## 2018-06-05 ENCOUNTER — Telehealth: Payer: Self-pay

## 2018-06-05 ENCOUNTER — Other Ambulatory Visit: Payer: Self-pay

## 2018-06-05 MED ORDER — LOSARTAN POTASSIUM-HCTZ 50-12.5 MG PO TABS: 1.0000 | ORAL_TABLET | Freq: Every day | ORAL | 1 refills | Status: DC

## 2018-06-05 NOTE — Telephone Encounter (Signed)
Medication Has been sent.

## 2018-06-05 NOTE — Telephone Encounter (Signed)
Copied from CRM [id number]. Topic: General - Other >> Jun 05, 2018  4:02 PM Tamela Oddi wrote: Reason for CRM: Patient called to confirm that his medication be sent to the correct pharmacy [id number], [place] - [address] [phone] (Phone) [phone] (Fax).  Spoke with Augusto Garbe in the office and she stated that she would have it changed to this pharmacy for the patient.  CB# [phone].

## 2018-06-17 ENCOUNTER — Telehealth: Payer: Self-pay | Admitting: Family

## 2018-06-17 ENCOUNTER — Other Ambulatory Visit: Payer: Self-pay | Admitting: Family

## 2018-06-17 ENCOUNTER — Encounter: Payer: Self-pay | Admitting: Family

## 2018-06-17 DIAGNOSIS — E785 Hyperlipidemia, unspecified: Secondary | ICD-10-CM

## 2018-06-17 DIAGNOSIS — E039 Hypothyroidism, unspecified: Secondary | ICD-10-CM

## 2018-06-17 MED ORDER — LEVOTHYROXINE SODIUM 137 MCG PO TABS: 137.0000 ug | ORAL_TABLET | Freq: Every day | ORAL | 0 refills | Status: DC

## 2018-06-17 MED ORDER — SERTRALINE HCL 100 MG PO TABS: 100.0000 mg | ORAL_TABLET | Freq: Two times a day (BID) | ORAL | 0 refills | Status: DC

## 2018-06-17 MED ORDER — MONTELUKAST SODIUM 10 MG PO TABS: 10.0000 mg | ORAL_TABLET | Freq: Every day | ORAL | 0 refills | Status: DC

## 2018-06-17 MED ORDER — BECLOMETHASONE DIPROP HFA 80 MCG/ACT IN AERB: 1.0000 | INHALATION_SPRAY | Freq: Two times a day (BID) | RESPIRATORY_TRACT | 0 refills | Status: DC

## 2018-06-17 MED ORDER — ATORVASTATIN CALCIUM 20 MG PO TABS: 20.0000 mg | ORAL_TABLET | Freq: Every day | ORAL | 0 refills | Status: DC

## 2018-06-17 NOTE — Telephone Encounter (Signed)
Spoke with pt. Pt was very upset, talking in very loud, nearly yelling voice. He voiced frustration that his prescriptions were not given refills up to 6 months when he was seen at his last visit in July. States this is very inconvenient for him to have to call in for refills as he is a long distance truck driver and he doesn't understand why he wasn't given refills up to 6 months since that is when he was told to return. He reports that he called his pharmacy for refills 10 days ago and was told refills were sent to [place]. Apologized to pt but I did not see that we had received any paper refill requests and the only electronic request that came through to [place] was on 06/05/18 for Losartan HCTZ and it was refilled on that date. Pt disputed information given to him and said we were lying because the pharmacy told him something different. Apologized to pt again for the stress these refills have caused him and that I would refill everything [facility] that was not previously given refills. Pt states he has plenty of albuterol and doesn't need that one at this time. Pt requests to get supply to last until his next appt. And he will discuss refills with PCP at that time. Pt requests to schedule his routine 6 month f/u and appointment was made for 08/22/18 at 7:20am. Spoke with pharmacist to verify they received Losartan HCTZ refill from [place] on 06/05/18 and she states they had to order it to come in and it was just filled today and is waiting on pt to pick up RX. Mychart message sent to pt to confirm refills.

## 2018-06-17 NOTE — Telephone Encounter (Unsigned)
Copied from CRM [id number]. Topic: General - Other >> Jun 17, 2018  2:53 PM Percival Spanish wrote: Pt changed to a different pharmacy and need to have new RX's sent to [facility]    albuterol (PROVENTIL HFA;VENTOLIN HFA) 108 (90 Base) MCG/ACT inhaler   atorvastatin (LIPITOR) 20 MG tablet    levothyroxine (SYNTHROID) 137 MCG tablet   losartan-hydrochlorothiazide (HYZAAR) 50-12.5 MG tablet      montelukast (SINGULAIR) 10 MG tablet   sertraline (ZOLOFT) 100 MG tablet  [facility] [address]

## 2018-06-20 ENCOUNTER — Ambulatory Visit: Payer: Self-pay | Admitting: Interventional Cardiology

## 2018-07-09 ENCOUNTER — Encounter: Payer: Self-pay | Admitting: Cardiothoracic Surgery

## 2018-08-22 ENCOUNTER — Other Ambulatory Visit: Payer: Self-pay | Admitting: Family

## 2018-08-22 ENCOUNTER — Ambulatory Visit: Payer: Self-pay | Admitting: Family

## 2018-08-22 DIAGNOSIS — E039 Hypothyroidism, unspecified: Secondary | ICD-10-CM

## 2018-08-22 MED ORDER — LEVOTHYROXINE SODIUM 137 MCG PO TABS: 137.0000 ug | ORAL_TABLET | Freq: Every day | ORAL | 0 refills | Status: DC

## 2018-08-22 NOTE — Telephone Encounter (Signed)
Copied from CRM [id number]. Topic: Quick Communication - Rx Refill/Question >> Aug 22, 2018  7:13 AM Tamela Oddi wrote: Medication: levothyroxine (SYNTHROID) 137 MCG tablet  Patient called to request a refill for the above medication  Preferred Pharmacy (with phone number or street name): [id number], [place] - [address] [phone] (Phone) [phone] (Fax)

## 2018-09-03 ENCOUNTER — Ambulatory Visit: Payer: Self-pay | Admitting: Family

## 2018-09-03 ENCOUNTER — Encounter: Payer: Self-pay | Admitting: Family

## 2018-09-03 ENCOUNTER — Encounter: Payer: Self-pay | Admitting: Cardiothoracic Surgery

## 2018-09-03 VITALS — BP 120/88 | HR 65 | Temp 98.2°F | Resp 16 | Ht 70.0 in | Wt 211.0 lb

## 2018-09-03 DIAGNOSIS — I10 Essential (primary) hypertension: Secondary | ICD-10-CM

## 2018-09-03 DIAGNOSIS — I350 Nonrheumatic aortic (valve) stenosis: Secondary | ICD-10-CM

## 2018-09-03 DIAGNOSIS — I712 Thoracic aortic aneurysm, without rupture, unspecified: Secondary | ICD-10-CM

## 2018-09-03 DIAGNOSIS — F32A Depression, unspecified: Secondary | ICD-10-CM

## 2018-09-03 DIAGNOSIS — E039 Hypothyroidism, unspecified: Secondary | ICD-10-CM

## 2018-09-03 DIAGNOSIS — J45909 Unspecified asthma, uncomplicated: Secondary | ICD-10-CM

## 2018-09-03 DIAGNOSIS — E785 Hyperlipidemia, unspecified: Secondary | ICD-10-CM

## 2018-09-03 DIAGNOSIS — F329 Major depressive disorder, single episode, unspecified: Secondary | ICD-10-CM

## 2018-09-03 LAB — COMPREHENSIVE METABOLIC PANEL
ALBUMIN: 4.3 g/dL (ref 3.5–5.2)
ALK PHOS: 63 U/L (ref 39–117)
ALT: 20 U/L (ref 0–53)
AST: 22 U/L (ref 0–37)
BILIRUBIN TOTAL: 0.7 mg/dL (ref 0.2–1.2)
BUN: 16 mg/dL (ref 6–23)
CO2: 30 mEq/L (ref 19–32)
Calcium: 9.9 mg/dL (ref 8.4–10.5)
Chloride: 103 mEq/L (ref 96–112)
Creatinine, Ser: 1.16 mg/dL (ref 0.40–1.50)
GFR: 67.48 mL/min (ref >60.00)
GLUCOSE: 94 mg/dL (ref 70–99)
Potassium: 3.8 mEq/L (ref 3.5–5.1)
SODIUM: 139 meq/L (ref 135–145)
TOTAL PROTEIN: 7.2 g/dL (ref 6.0–8.3)

## 2018-09-03 LAB — TSH: TSH: 3.91 u[IU]/mL (ref 0.35–4.50)

## 2018-09-03 MED ORDER — ALBUTEROL SULFATE HFA 108 (90 BASE) MCG/ACT IN AERS: 2.0000 | INHALATION_SPRAY | Freq: Four times a day (QID) | RESPIRATORY_TRACT | 5 refills | Status: DC | PRN

## 2018-09-03 MED ORDER — LORATADINE 10 MG PO TABS: 10.0000 mg | ORAL_TABLET | Freq: Every day | ORAL | 11 refills | Status: DC

## 2018-09-03 MED ORDER — ATORVASTATIN CALCIUM 20 MG PO TABS: 20.0000 mg | ORAL_TABLET | Freq: Every day | ORAL | 1 refills | Status: DC

## 2018-09-03 MED ORDER — BECLOMETHASONE DIPROP HFA 80 MCG/ACT IN AERB: 1.0000 | INHALATION_SPRAY | Freq: Two times a day (BID) | RESPIRATORY_TRACT | 1 refills | Status: DC

## 2018-09-03 MED ORDER — MONTELUKAST SODIUM 10 MG PO TABS: 10.0000 mg | ORAL_TABLET | Freq: Every day | ORAL | 1 refills | Status: DC

## 2018-09-03 MED ORDER — LOSARTAN POTASSIUM-HCTZ 50-12.5 MG PO TABS: 1.0000 | ORAL_TABLET | Freq: Every day | ORAL | 1 refills | Status: DC

## 2018-09-03 MED ORDER — LEVOTHYROXINE SODIUM 137 MCG PO TABS: 137.0000 ug | ORAL_TABLET | Freq: Every day | ORAL | 1 refills | Status: DC

## 2018-09-03 MED ORDER — SERTRALINE HCL 100 MG PO TABS: 100.0000 mg | ORAL_TABLET | Freq: Two times a day (BID) | ORAL | 1 refills | Status: DC

## 2018-09-03 NOTE — Patient Instructions (Signed)
Please complete lab work prior to leaving.   

## 2018-09-03 NOTE — Progress Notes (Signed)
Subjective:    Patient ID: Robert Hobbs, male    DOB: [birth year], 63 y.o.   MRN: [id number]  HPI  Patient is a 63 year old male who presents today for routine follow-up.  Hyperlipidemia-patient continues Lipitor 20 mg once daily. Lab Results  Component Value Date   CHOL 165 01/09/2018   HDL 49.10 01/09/2018   LDLCALC 84 01/09/2018   TRIG 159.0 (H) 01/09/2018   CHOLHDL 3 01/09/2018   Hypertension-maintained on Hyzaar. Denies LE swelling.  Some SOB.    BP Readings from Last 3 Encounters:  09/03/18 120/88  02/17/18 121/87  01/15/18 126/86   Anxiety-continue Zoloft. Reports that his mood is "fantastic" as long as he is takign zolof.t   Ascending aortic aneurysm/aortic stenosis-has declined surgical repair despite risks.  States he does have an upcoming appointment with cardiology and he will again discuss with them.  Asthma- maintained on Qvar and Singulair. Reports that this is "allergy season" for him. Had to use albuterol yesterday. He is on claritin.    Hypothyroid- reports good compliance with Synthroid. Lab Results  Component Value Date   TSH 1.26 02/17/2018    Review of Systems    see HPI  Past Medical History:  Diagnosis Date  . Aortic stenosis   . Hypertension   . Mitral valve prolapse 02/24/2014     Social History   Socioeconomic History  . Marital status: Married    Spouse name: Not on file  . Number of children: Not on file  . Years of education: Not on file  . Highest education level: Not on file  Occupational History  . Not on file  Social Needs  . Financial resource strain: Not on file  . Food insecurity:    Worry: Not on file    Inability: Not on file  . Transportation needs:    Medical: Not on file    Non-medical: Not on file  Tobacco Use  . Smoking status: Never Smoker  . Smokeless tobacco: Never Used  Substance and Sexual Activity  . Alcohol use: No  . Drug use: No  . Sexual activity: Not on file  Lifestyle  . Physical  activity:    Days per week: Not on file    Minutes per session: Not on file  . Stress: Not on file  Relationships  . Social connections:    Talks on phone: Not on file    Gets together: Not on file    Attends religious service: Not on file    Active member of club or organization: Not on file    Attends meetings of clubs or organizations: Not on file    Relationship status: Not on file  . Intimate partner violence:    Fear of current or ex partner: Not on file    Emotionally abused: Not on file    Physically abused: Not on file    Forced sexual activity: Not on file  Other Topics Concern  . Not on file  Social History Narrative   He is separated   He has a 65 year old  (daughter) and 30 yr old (daughter) 26 yr old (daughter) and 21 year (son) 40 year old daughter   Long distance truck driver   Enjoys biking but does not have much time to do that    Past Surgical History:  Procedure Laterality Date  . ARTHROSCOPIC REPAIR ACL Left 1996  . ORIF HUMERUS FRACTURE Right 1978    Family History  Problem Relation Age  of Onset  . Anxiety disorder Mother   . Arrhythmia Mother        "died in her sleep" ? MI  . Alzheimer's disease Father   . Obesity Sister   . Prostate cancer Brother     Allergies  Allergen Reactions  . Cleocin [Clindamycin Hcl] Nausea And Vomiting  . Shellfish Allergy     Current Outpatient Medications on File Prior to Visit  Medication Sig Dispense Refill  . albuterol (PROVENTIL HFA;VENTOLIN HFA) 108 (90 Base) MCG/ACT inhaler Inhale 2 puffs into the lungs every 6 (six) hours as needed for wheezing or shortness of breath. 1 Inhaler 5  . atorvastatin (LIPITOR) 20 MG tablet Take 1 tablet (20 mg total) by mouth daily. 90 tablet 0  . beclomethasone (QVAR REDIHALER) 80 MCG/ACT inhaler Inhale 1 puff into the lungs 2 (two) times daily. 3 Inhaler 0  . levothyroxine (SYNTHROID) 137 MCG tablet Take 1 tablet (137 mcg total) by mouth daily before breakfast. 90 tablet 0    . losartan-hydrochlorothiazide (HYZAAR) 50-12.5 MG tablet Take 1 tablet by mouth daily. 90 tablet 1  . montelukast (SINGULAIR) 10 MG tablet Take 1 tablet (10 mg total) by mouth at bedtime. 90 tablet 0  . sertraline (ZOLOFT) 100 MG tablet Take 1 tablet (100 mg total) by mouth 2 (two) times daily. 180 tablet 0   No current facility-administered medications on file prior to visit.     BP 120/88 (BP Location: Left Arm, Patient Position: Sitting, Cuff Size: Small)   Pulse 65   Temp 98.2 F (36.8 C) (Oral)   Resp 16   Ht 5\' 10"  (1.778 m)   Wt 211 lb (95.7 kg)   SpO2 100%   BMI 30.28 kg/m    Objective:   Physical Exam Constitutional:      General: He is not in acute distress.    Appearance: He is well-developed.  HENT:     Head: Normocephalic and atraumatic.  Cardiovascular:     Rate and Rhythm: Normal rate and regular rhythm.     Heart sounds: No murmur.  Pulmonary:     Effort: Pulmonary effort is normal. No respiratory distress.     Breath sounds: Normal breath sounds. No wheezing or rales.  Skin:    General: Skin is warm and dry.  Neurological:     Mental Status: He is alert and oriented to person, place, and time.  Psychiatric:        Behavior: Behavior normal.        Thought Content: Thought content normal.           Assessment & Plan:  Hypertension-blood pressure stable on current medications.  Continue same.  Obtain follow-up basic metabolic panel  Depression-stable on Zoloft.  Continue same.  Ascending aortic aneurysm/aortic stenosis- advised patient to follow-up with cardiology for further discussion.  Asthma- recent increase in symptoms which he attributes to allergies.  I have advised him to continue Claritin and Singulair as well as Qvar.  Albuterol as needed and to let me know if he has frequency of use of albuterol increases.  Hypothyroid-clinically stable on Synthroid.  Will obtain TSH.  Continue Synthroid.  We did discuss need for routine preventive  care visit and screening for prostate cancer colon cancer and update of immunizations.  He is encouraged to make an appointment for routine physical.  Patient verbalizes understanding.

## 2018-09-16 ENCOUNTER — Ambulatory Visit: Payer: Self-pay | Admitting: Interventional Cardiology

## 2018-12-25 ENCOUNTER — Telehealth: Payer: Self-pay

## 2018-12-25 NOTE — Telephone Encounter (Signed)
Copied from CRM [id number]. Topic: General - Other >> Dec 24, 2018  4:05 PM Gwenlyn Fudge A wrote: Reason for CRM: Tresa Endo, pharmacist, called to let PCP know they are transferring medication levothyroxine, from [facility] and they are using a different manufacturer.

## 2018-12-26 NOTE — Telephone Encounter (Signed)
Noted  

## 2019-02-24 ENCOUNTER — Telehealth: Payer: Self-pay | Admitting: Family

## 2019-02-24 DIAGNOSIS — E039 Hypothyroidism, unspecified: Secondary | ICD-10-CM

## 2019-02-24 DIAGNOSIS — E785 Hyperlipidemia, unspecified: Secondary | ICD-10-CM

## 2019-02-24 NOTE — Telephone Encounter (Signed)
error 

## 2019-02-24 NOTE — Telephone Encounter (Signed)
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 He is requesting 30 day supply until appt scheduled for 8/4.        atorvastatin (LIPITOR) 20 MG tablet        levothyroxine (SYNTHROID) 137 MCG tablet        losartan-hydrochlorothiazide (HYZAAR) 50-12.5 MG tablet    montelukast (SINGULAIR) 10 MG tablet    sertraline (ZOLOFT) 100 MG tablet    [facility] at [place], [place] [phone] (Phone) [phone] (Fax)

## 2019-02-25 ENCOUNTER — Other Ambulatory Visit: Payer: Self-pay

## 2019-02-25 DIAGNOSIS — E785 Hyperlipidemia, unspecified: Secondary | ICD-10-CM

## 2019-02-25 DIAGNOSIS — E039 Hypothyroidism, unspecified: Secondary | ICD-10-CM

## 2019-02-25 MED ORDER — SERTRALINE HCL 100 MG PO TABS: 100.0000 mg | ORAL_TABLET | Freq: Two times a day (BID) | ORAL | 0 refills | Status: DC

## 2019-02-25 MED ORDER — LOSARTAN POTASSIUM-HCTZ 50-12.5 MG PO TABS: 1.0000 | ORAL_TABLET | Freq: Every day | ORAL | 0 refills | Status: DC

## 2019-02-25 MED ORDER — ATORVASTATIN CALCIUM 20 MG PO TABS: 20.0000 mg | ORAL_TABLET | Freq: Every day | ORAL | 0 refills | Status: DC

## 2019-02-25 MED ORDER — LEVOTHYROXINE SODIUM 137 MCG PO TABS: 137.0000 ug | ORAL_TABLET | Freq: Every day | ORAL | 0 refills | Status: DC

## 2019-02-25 NOTE — Telephone Encounter (Signed)
30 days refills sent to the pharmacy requested, Called patient to advised medications were refilled and to keep upcoming appointment.

## 2019-03-24 ENCOUNTER — Telehealth: Payer: Self-pay | Admitting: Family

## 2019-03-24 ENCOUNTER — Ambulatory Visit (INDEPENDENT_AMBULATORY_CARE_PROVIDER_SITE_OTHER): Payer: Self-pay | Admitting: Family

## 2019-03-24 ENCOUNTER — Encounter: Payer: Self-pay | Admitting: Family

## 2019-03-24 ENCOUNTER — Other Ambulatory Visit: Payer: Self-pay

## 2019-03-24 VITALS — BP 118/87 | HR 78 | Temp 98.3°F | Resp 16 | Ht 70.0 in | Wt 204.0 lb

## 2019-03-24 DIAGNOSIS — J45909 Unspecified asthma, uncomplicated: Secondary | ICD-10-CM

## 2019-03-24 DIAGNOSIS — I350 Nonrheumatic aortic (valve) stenosis: Secondary | ICD-10-CM

## 2019-03-24 DIAGNOSIS — I10 Essential (primary) hypertension: Secondary | ICD-10-CM

## 2019-03-24 DIAGNOSIS — E038 Other specified hypothyroidism: Secondary | ICD-10-CM

## 2019-03-24 DIAGNOSIS — E785 Hyperlipidemia, unspecified: Secondary | ICD-10-CM

## 2019-03-24 DIAGNOSIS — F419 Anxiety disorder, unspecified: Secondary | ICD-10-CM

## 2019-03-24 DIAGNOSIS — I712 Thoracic aortic aneurysm, without rupture, unspecified: Secondary | ICD-10-CM

## 2019-03-24 DIAGNOSIS — E039 Hypothyroidism, unspecified: Secondary | ICD-10-CM

## 2019-03-24 DIAGNOSIS — R911 Solitary pulmonary nodule: Secondary | ICD-10-CM

## 2019-03-24 LAB — COMPREHENSIVE METABOLIC PANEL
ALT: 13 U/L (ref 0–53)
AST: 16 U/L (ref 0–37)
Albumin: 4.5 g/dL (ref 3.5–5.2)
Alkaline Phosphatase: 72 U/L (ref 39–117)
BUN: 15 mg/dL (ref 6–23)
CO2: 29 mEq/L (ref 19–32)
Calcium: 10 mg/dL (ref 8.4–10.5)
Chloride: 102 mEq/L (ref 96–112)
Creatinine, Ser: 1.17 mg/dL (ref 0.40–1.50)
GFR: 62.75 mL/min (ref >60.00)
Glucose, Bld: 89 mg/dL (ref 70–99)
Potassium: 4.6 mEq/L (ref 3.5–5.1)
Sodium: 138 mEq/L (ref 135–145)
Total Bilirubin: 0.6 mg/dL (ref 0.2–1.2)
Total Protein: 7.4 g/dL (ref 6.0–8.3)

## 2019-03-24 LAB — LIPID PANEL
Cholesterol: 178 mg/dL (ref 0–200)
HDL: 53 mg/dL (ref >39.00)
LDL Cholesterol: 103 mg/dL — ABNORMAL HIGH (ref 0–99)
NonHDL: 124.72
Total CHOL/HDL Ratio: 3
Triglycerides: 110 mg/dL (ref 0.0–149.0)
VLDL: 22 mg/dL (ref 0.0–40.0)

## 2019-03-24 LAB — TSH: TSH: 3.12 u[IU]/mL (ref 0.35–4.50)

## 2019-03-24 MED ORDER — QVAR REDIHALER 80 MCG/ACT IN AERB: 1.0000 | INHALATION_SPRAY | Freq: Two times a day (BID) | RESPIRATORY_TRACT | 5 refills | Status: DC

## 2019-03-24 MED ORDER — ALBUTEROL SULFATE HFA 108 (90 BASE) MCG/ACT IN AERS: 2.0000 | INHALATION_SPRAY | Freq: Four times a day (QID) | RESPIRATORY_TRACT | 5 refills | Status: DC | PRN

## 2019-03-24 MED ORDER — SERTRALINE HCL 100 MG PO TABS: 100.0000 mg | ORAL_TABLET | Freq: Two times a day (BID) | ORAL | 1 refills | Status: DC

## 2019-03-24 MED ORDER — MONTELUKAST SODIUM 10 MG PO TABS: 10.0000 mg | ORAL_TABLET | Freq: Every day | ORAL | 1 refills | Status: DC

## 2019-03-24 MED ORDER — LEVOTHYROXINE SODIUM 137 MCG PO TABS: 137.0000 ug | ORAL_TABLET | Freq: Every day | ORAL | 1 refills | Status: DC

## 2019-03-24 MED ORDER — LOSARTAN POTASSIUM-HCTZ 50-12.5 MG PO TABS: 1.0000 | ORAL_TABLET | Freq: Every day | ORAL | 1 refills | Status: DC

## 2019-03-24 MED ORDER — ATORVASTATIN CALCIUM 20 MG PO TABS: 20.0000 mg | ORAL_TABLET | Freq: Every day | ORAL | 1 refills | Status: DC

## 2019-03-24 MED ORDER — SERTRALINE HCL 100 MG PO TABS: 100.0000 mg | ORAL_TABLET | Freq: Two times a day (BID) | ORAL | 0 refills | Status: DC

## 2019-03-24 NOTE — Patient Instructions (Signed)
Please complete lab work prior to leaving.  You should be contacted about scheduling your PET scan and CT of your aorta.

## 2019-03-24 NOTE — Telephone Encounter (Signed)
Please initiate cologuard order for patient. He is cash pay

## 2019-03-24 NOTE — Telephone Encounter (Signed)
Form [facility]ed [facility]

## 2019-03-24 NOTE — Progress Notes (Signed)
Subjective:    Patient ID: Robert Hobbs, male    DOB: 06-Sep-1955, 63 y.o.   MRN: [id number]  HPI  Patient is a 63 yr old male who presents today for follow up.  Hyperlipidemia- maintained on lipitor. Denies myalgia.  Lab Results  Component Value Date   CHOL 165 01/09/2018   HDL 49.10 01/09/2018   LDLCALC 84 01/09/2018   TRIG 159.0 (H) 01/09/2018   CHOLHDL 3 01/09/2018   Hypertension- maintained on hyzaar 50-12.5. BP Readings from Last 3 Encounters:  03/24/19 118/87  09/03/18 120/88  02/17/18 121/87   Anxiety- maintained on zoloft 100mg . Reports that his mood is good on zoloft.   Asthma/allergies-maintained on singulair, qvar and prn albuterol. Also uses prn clartin. Reports that his asthma is worse locally, was doing better when he was in [place]. Reports that he has not been using qvar recently. Using albuterol 1-2 times a day.   Hypothyroid- current dose of synthroid is 137 mcg. Reports that he has tried to improve his diet and has lost a few pounds.   Wt Readings from Last 3 Encounters:  03/24/19 204 lb (92.5 kg)  09/03/18 211 lb (95.7 kg)  02/17/18 208 lb (94.3 kg)    Lab Results  Component Value Date   TSH 3.91 09/03/2018   Thoracic aortic aneurysm/Aortic stenosis- has declined surgical intervention until this time.   Review of Systems See HPI  Past Medical History:  Diagnosis Date  . Aortic stenosis   . Hypertension   . Mitral valve prolapse 02/24/2014     Social History   Socioeconomic History  . Marital status: Married    Spouse name: Not on file  . Number of children: Not on file  . Years of education: Not on file  . Highest education level: Not on file  Occupational History  . Not on file  Social Needs  . Financial resource strain: Not on file  . Food insecurity    Worry: Not on file    Inability: Not on file  . Transportation needs    Medical: Not on file    Non-medical: Not on file  Tobacco Use  . Smoking status: Never Smoker  .  Smokeless tobacco: Never Used  Substance and Sexual Activity  . Alcohol use: No  . Drug use: No  . Sexual activity: Not on file  Lifestyle  . Physical activity    Days per week: Not on file    Minutes per session: Not on file  . Stress: Not on file  Relationships  . Social Musicianconnections    Talks on phone: Not on file    Gets together: Not on file    Attends religious service: Not on file    Active member of club or organization: Not on file    Attends meetings of clubs or organizations: Not on file    Relationship status: Not on file  . Intimate partner violence    Fear of current or ex partner: Not on file    Emotionally abused: Not on file    Physically abused: Not on file    Forced sexual activity: Not on file  Other Topics Concern  . Not on file  Social History Narrative   He is separated   He has a 64 year old  (daughter) and 64 yr old (daughter) 64 yr old (daughter) and 623 year (son) 64 year old daughter   Long distance truck driver   Enjoys biking but does not have  much time to do that    Past Surgical History:  Procedure Laterality Date  . ARTHROSCOPIC REPAIR ACL Left 1996  . ORIF HUMERUS FRACTURE Right 1978    Family History  Problem Relation Age of Onset  . Anxiety disorder Mother   . Arrhythmia Mother        "died in her sleep" ? MI  . Alzheimer's disease Father   . Obesity Sister   . Prostate cancer Brother     Allergies  Allergen Reactions  . Cleocin [Clindamycin Hcl] Nausea And Vomiting  . Shellfish Allergy     Current Outpatient Medications on File Prior to Visit  Medication Sig Dispense Refill  . albuterol (PROVENTIL HFA;VENTOLIN HFA) 108 (90 Base) MCG/ACT inhaler Inhale 2 puffs into the lungs every 6 (six) hours as needed for wheezing or shortness of breath. 1 Inhaler 5  . atorvastatin (LIPITOR) 20 MG tablet Take 1 tablet (20 mg total) by mouth daily. 30 tablet 0  . beclomethasone (QVAR REDIHALER) 80 MCG/ACT inhaler Inhale 1 puff into the lungs  2 (two) times daily. 3 Inhaler 1  . levothyroxine (SYNTHROID) 137 MCG tablet Take 1 tablet (137 mcg total) by mouth daily before breakfast. 30 tablet 0  . loratadine (CLARITIN) 10 MG tablet Take 1 tablet (10 mg total) by mouth daily. 30 tablet 11  . losartan-hydrochlorothiazide (HYZAAR) 50-12.5 MG tablet Take 1 tablet by mouth daily. 30 tablet 0  . montelukast (SINGULAIR) 10 MG tablet Take 1 tablet (10 mg total) by mouth at bedtime. 90 tablet 1  . sertraline (ZOLOFT) 100 MG tablet Take 1 tablet (100 mg total) by mouth 2 (two) times daily. 60 tablet 0   No current facility-administered medications on file prior to visit.     BP 118/87 (BP Location: Right Arm, Patient Position: Sitting, Cuff Size: Large)   Pulse 78   Temp 98.3 F (36.8 C) (Oral)   Resp 16   Ht 5\' 10"  (1.778 m)   Wt 204 lb (92.5 kg)   SpO2 99%   BMI 29.27 kg/m       Objective:   Physical Exam Constitutional:      General: He is not in acute distress.    Appearance: He is well-developed.  HENT:     Head: Normocephalic and atraumatic.  Cardiovascular:     Rate and Rhythm: Normal rate and regular rhythm.     Heart sounds: Murmur present. Systolic murmur present.  Pulmonary:     Effort: Pulmonary effort is normal. No respiratory distress.     Breath sounds: Normal breath sounds. No wheezing or rales.  Skin:    General: Skin is warm and dry.  Neurological:     Mental Status: He is alert and oriented to person, place, and time.  Psychiatric:        Behavior: Behavior normal.        Thought Content: Thought content normal.           Assessment & Plan:  AS/Thoracic aneurysm-  Obtain follow up CTA, advised pt to schedule follow up with thoracic surgery.  Pulmonary nodule- noted as incidental finding on last CTA which was ordered by his surgeon. Will obtain PET-CT to further evaluate.  HTN- BP stable, continue current meds.   Asthma/allergies- stable, continue current meds.  Hyperlipidemia- obtain  followup lipid panel.  Anxiety- stable on zoloft.   Hypothyroid- clinically stable. Obtain follow up tsh.

## 2019-03-25 ENCOUNTER — Ambulatory Visit (HOSPITAL_BASED_OUTPATIENT_CLINIC_OR_DEPARTMENT_OTHER)
Admission: RE | Admit: 2019-03-25 | Discharge: 2019-03-25 | Disposition: A | Discharge status: Home / Self Care | Payer: Self-pay | Source: Ambulatory Visit | Attending: Family | Admitting: Family

## 2019-03-25 ENCOUNTER — Encounter: Payer: Self-pay | Admitting: Family

## 2019-03-25 DIAGNOSIS — I712 Thoracic aortic aneurysm, without rupture, unspecified: Secondary | ICD-10-CM

## 2019-03-25 MED ORDER — IOHEXOL 350 MG/ML SOLN
100.0000 mL | Freq: Once | INTRAVENOUS | Status: AC | PRN
  Administered 2019-03-25: 100 mL via INTRAVENOUS

## 2019-03-31 NOTE — Progress Notes (Signed)
Mailed out to patient 

## 2019-04-13 ENCOUNTER — Telehealth: Payer: Self-pay | Admitting: Family

## 2019-04-13 NOTE — Telephone Encounter (Signed)
Please contact pt and let him know that the area in his right lung seen on CT last year was re-evaluated by CT this year and the radiologist thinks that it looks like scarring.  I think it is OK for him to hold off on doing the PET scan since he is self pay.

## 2019-04-13 NOTE — Telephone Encounter (Signed)
Patient advised of results and provider's recommendations. He verbalized understanding  

## 2019-11-13 ENCOUNTER — Other Ambulatory Visit: Payer: Self-pay | Admitting: Family

## 2019-11-13 ENCOUNTER — Telehealth: Payer: Self-pay

## 2019-11-13 NOTE — Telephone Encounter (Signed)
Patient called in to see if NP Peggyann Juba could send in a proscription for albuterol (VENTOLIN HFA) 108 (90 Base) MCG/ACT inhaler [[id number]]   atorvastatin (LIPITOR) 20 MG tablet [[id number]  beclomethasone (QVAR REDIHALER) 80 MCG/ACT inhaler [[id number]]   levothyroxine (SYNTHROID) 137 MCG tablet [[id number]]   loratadine (CLARITIN) 10 MG tablet [[id number]]   losartan-hydrochlorothiazide (HYZAAR) 50-12.5 MG tablet [[id number]]   montelukast (SINGULAIR) 10 MG tablet [[id number]]   sertraline (ZOLOFT) 100 MG tablet [[id number]]   Per the patient he would like all of the prescriptions hand written and he wants a follow up call to let him know when its ready. Please call the patient back at [phone]

## 2019-11-24 NOTE — Telephone Encounter (Signed)
ON 11/13/19 PT was scheduled for April 13th with Melissa @ 11 for a 6 month follow up

## 2019-12-01 ENCOUNTER — Ambulatory Visit (INDEPENDENT_AMBULATORY_CARE_PROVIDER_SITE_OTHER): Payer: Self-pay | Admitting: Family

## 2019-12-01 ENCOUNTER — Other Ambulatory Visit: Payer: Self-pay

## 2019-12-01 ENCOUNTER — Encounter: Payer: Self-pay | Admitting: Family

## 2019-12-01 VITALS — BP 132/84 | HR 68 | Temp 97.7°F | Resp 16 | Ht 70.0 in | Wt 205.0 lb

## 2019-12-01 DIAGNOSIS — E039 Hypothyroidism, unspecified: Secondary | ICD-10-CM

## 2019-12-01 DIAGNOSIS — E038 Other specified hypothyroidism: Secondary | ICD-10-CM

## 2019-12-01 DIAGNOSIS — E782 Mixed hyperlipidemia: Secondary | ICD-10-CM

## 2019-12-01 DIAGNOSIS — I350 Nonrheumatic aortic (valve) stenosis: Secondary | ICD-10-CM

## 2019-12-01 DIAGNOSIS — E785 Hyperlipidemia, unspecified: Secondary | ICD-10-CM

## 2019-12-01 DIAGNOSIS — I10 Essential (primary) hypertension: Secondary | ICD-10-CM

## 2019-12-01 DIAGNOSIS — I712 Thoracic aortic aneurysm, without rupture, unspecified: Secondary | ICD-10-CM

## 2019-12-01 LAB — COMPREHENSIVE METABOLIC PANEL
ALT: 20 U/L (ref 0–53)
AST: 23 U/L (ref 0–37)
Albumin: 4.4 g/dL (ref 3.5–5.2)
Alkaline Phosphatase: 77 U/L (ref 39–117)
BUN: 19 mg/dL (ref 6–23)
CO2: 30 mEq/L (ref 19–32)
Calcium: 9.7 mg/dL (ref 8.4–10.5)
Chloride: 100 mEq/L (ref 96–112)
Creatinine, Ser: 1.1 mg/dL (ref 0.40–1.50)
GFR: 67.24 mL/min (ref >60.00)
Glucose, Bld: 87 mg/dL (ref 70–99)
Potassium: 4.5 mEq/L (ref 3.5–5.1)
Sodium: 137 mEq/L (ref 135–145)
Total Bilirubin: 0.6 mg/dL (ref 0.2–1.2)
Total Protein: 7.1 g/dL (ref 6.0–8.3)

## 2019-12-01 LAB — LIPID PANEL
Cholesterol: 167 mg/dL (ref 0–200)
HDL: 54.7 mg/dL (ref >39.00)
LDL Cholesterol: 98 mg/dL (ref 0–99)
NonHDL: 112.07
Total CHOL/HDL Ratio: 3
Triglycerides: 68 mg/dL (ref 0.0–149.0)
VLDL: 13.6 mg/dL (ref 0.0–40.0)

## 2019-12-01 LAB — TSH: TSH: 2.99 u[IU]/mL (ref 0.35–4.50)

## 2019-12-01 MED ORDER — LEVOTHYROXINE SODIUM 137 MCG PO TABS: 137.0000 ug | ORAL_TABLET | Freq: Every day | ORAL | 1 refills | Status: DC

## 2019-12-01 MED ORDER — MONTELUKAST SODIUM 10 MG PO TABS: 10.0000 mg | ORAL_TABLET | Freq: Every day | ORAL | 1 refills | Status: DC

## 2019-12-01 MED ORDER — LOSARTAN POTASSIUM-HCTZ 50-12.5 MG PO TABS: 1.0000 | ORAL_TABLET | Freq: Every day | ORAL | 1 refills | Status: DC

## 2019-12-01 MED ORDER — ALBUTEROL SULFATE HFA 108 (90 BASE) MCG/ACT IN AERS: 2.0000 | INHALATION_SPRAY | Freq: Four times a day (QID) | RESPIRATORY_TRACT | 5 refills | Status: DC | PRN

## 2019-12-01 MED ORDER — SERTRALINE HCL 100 MG PO TABS: 100.0000 mg | ORAL_TABLET | Freq: Two times a day (BID) | ORAL | 1 refills | Status: DC

## 2019-12-01 MED ORDER — ATORVASTATIN CALCIUM 20 MG PO TABS: 20.0000 mg | ORAL_TABLET | Freq: Every day | ORAL | 1 refills | Status: DC

## 2019-12-01 MED ORDER — QVAR REDIHALER 80 MCG/ACT IN AERB: 1.0000 | INHALATION_SPRAY | Freq: Two times a day (BID) | RESPIRATORY_TRACT | 5 refills | Status: DC

## 2019-12-01 NOTE — Progress Notes (Signed)
Subjective:    Patient ID: Robert Hobbs, male    DOB: [birth year], 64 y.o.   MRN: [id number]  HPI  Patient is a 64 yr old male who presents today for follow up.  HTN- maintained on hyzaar 50-12.5mg . Notes that he gets dizzy when he lays flat. He denies LE edema.  He reports tha the does get some swelling in his fingers. He is walking 15 miles a week. Denies DOE.    BP Readings from Last 3 Encounters:  12/01/19 132/84  03/24/19 118/87  09/03/18 120/88   Anxiety-maintained on sertraline 100mg  bid.  Reports stable.   AS/Thoracic Aortic Aneurysm- last imaged 03/24/20.  5cm by 5cm.  Previously has declined surgical intervention.  Asymptomatic.   Hypothyroid- maintained on synthroid 137 mcg.  Lab Results  Component Value Date   TSH 3.12 03/24/2019   Hyperlipidemia- maintained on atorvastatin.   Asthma/Allergies- maintained on singulair, qvar, prn albuterol. Reports allergies have calmed with addition of claritin.   Review of Systems See HPI  Past Medical History:  Diagnosis Date  . Aortic stenosis   . Hypertension   . Mitral valve prolapse 02/24/2014     Social History   Socioeconomic History  . Marital status: Married    Spouse name: Not on file  . Number of children: Not on file  . Years of education: Not on file  . Highest education level: Not on file  Occupational History  . Not on file  Tobacco Use  . Smoking status: Never Smoker  . Smokeless tobacco: Never Used  Substance and Sexual Activity  . Alcohol use: No  . Drug use: No  . Sexual activity: Not on file  Other Topics Concern  . Not on file  Social History Narrative   He is separated   He has a 66 year old  (daughter) and 29 yr old (daughter) 28 yr old (daughter) and 42 year (son) 50 year old daughter   Long distance truck driver   Enjoys biking but does not have much time to do that   Social Determinants of 33 Strain:   . Difficulty of Paying Living Expenses:   Food  Insecurity:   . Worried About Corporate investment banker in the Last Year:   . Programme researcher, broadcasting/film/video in the Last Year:   Transportation Needs:   . Barista (Medical):   Freight forwarder Lack of Transportation (Non-Medical):   Physical Activity:   . Days of Exercise per Week:   . Minutes of Exercise per Session:   Stress:   . Feeling of Stress :   Social Connections:   . Frequency of Communication with Friends and Family:   . Frequency of Social Gatherings with Friends and Family:   . Attends Religious Services:   . Active Member of Clubs or Organizations:   . Attends [employer] Meetings:   Banker Marital Status:   Intimate Partner Violence:   . Fear of Current or Ex-Partner:   . Emotionally Abused:   [place] Physically Abused:   . Sexually Abused:     Past Surgical History:  Procedure Laterality Date  . ARTHROSCOPIC REPAIR ACL Left 1996  . ORIF HUMERUS FRACTURE Right 1978    Family History  Problem Relation Age of Onset  . Anxiety disorder Mother   . Arrhythmia Mother        "died in her sleep" ? MI  . Alzheimer's disease Father   . Obesity Sister   .  Prostate cancer Brother     Allergies  Allergen Reactions  . Cleocin [Clindamycin Hcl] Nausea And Vomiting  . Shellfish Allergy     Current Outpatient Medications on File Prior to Visit  Medication Sig Dispense Refill  . albuterol (VENTOLIN HFA) 108 (90 Base) MCG/ACT inhaler Inhale 2 puffs into the lungs every 6 (six) hours as needed for wheezing or shortness of breath. 6.7 g 5  . atorvastatin (LIPITOR) 20 MG tablet Take 1 tablet (20 mg total) by mouth daily. 90 tablet 1  . beclomethasone (QVAR REDIHALER) 80 MCG/ACT inhaler Inhale 1 puff into the lungs 2 (two) times daily. 10.6 g 5  . levothyroxine (SYNTHROID) 137 MCG tablet Take 1 tablet (137 mcg total) by mouth daily before breakfast. 90 tablet 1  . loratadine (CLARITIN) 10 MG tablet Take 1 tablet (10 mg total) by mouth daily. 30 tablet 11  . losartan-hydrochlorothiazide  (HYZAAR) 50-12.5 MG tablet Take 1 tablet by mouth daily. 90 tablet 1  . montelukast (SINGULAIR) 10 MG tablet Take 1 tablet (10 mg total) by mouth at bedtime. 90 tablet 1  . sertraline (ZOLOFT) 100 MG tablet TAKE 1 TABLET BY MOUTH TWO TIMES A DAY 180 tablet 0   No current facility-administered medications on file prior to visit.    BP 132/84 (BP Location: Right Arm, Patient Position: Sitting, Cuff Size: Small)   Pulse 68   Temp 97.7 F (36.5 C) (Temporal)   Resp 16   Ht 5\' 10"  (1.778 m)   Wt 205 lb (93 kg)   SpO2 99%   BMI 29.41 kg/m       Objective:   Physical Exam Constitutional:      General: He is not in acute distress.    Appearance: He is well-developed.  HENT:     Head: Normocephalic and atraumatic.  Cardiovascular:     Rate and Rhythm: Normal rate and regular rhythm.     Heart sounds: No murmur (grade III systolic).  Pulmonary:     Effort: Pulmonary effort is normal. No respiratory distress.     Breath sounds: Normal breath sounds. No wheezing or rales.  Skin:    General: Skin is warm and dry.  Neurological:     Mental Status: He is alert and oriented to person, place, and time.  Psychiatric:        Behavior: Behavior normal.        Thought Content: Thought content normal.           Assessment & Plan:  Aortic stenosis (moderate to severe per Echo 01/09/18)- He is agreeable to follow up consultation with Dr. 01/11/18- referral placed. States he is very hesitant to consider surgical repair due to feeling that he is asymptomatic.    Thoracic Aortic Aneurysm- He is agreeable to a second opinion consultation with cardiothoracic surgery.   Hypothyroid- clinically stable on synthroid. Normal TSH. Continue current dose. Lab Results  Component Value Date   TSH 2.99 12/01/2019   Asthma/Allergies- stable. Continue claritin, albuterol prn. He has Qvar on hand to start if asthma symptoms worsen.   HTN- BP is stable. Continue current medications.  BP Readings from  Last 3 Encounters:  12/01/19 132/84  03/24/19 118/87  09/03/18 120/88   Hyperlipidemia- LDL at goal. Continue lipitor. Lab Results  Component Value Date   CHOL 167 12/01/2019   HDL 54.70 12/01/2019   LDLCALC 98 12/01/2019   TRIG 68.0 12/01/2019   CHOLHDL 3 12/01/2019    Anxiety- stable on sertraline, continue  current dose.   The patient is currently uninsured but plans to enroll in [place] in July.

## 2019-12-01 NOTE — Patient Instructions (Signed)
You should be contacted about scheduling your visit with the cardiologist and the new Cardiothoracic surgeon. Continue current medications.

## 2019-12-02 ENCOUNTER — Encounter: Payer: Self-pay | Admitting: Family

## 2019-12-03 NOTE — Progress Notes (Signed)
Mailed out to patient 

## 2019-12-11 ENCOUNTER — Encounter: Payer: Self-pay | Admitting: General Practice

## 2020-02-16 NOTE — Telephone Encounter (Signed)
Error

## 2020-03-23 ENCOUNTER — Encounter: Payer: Self-pay | Admitting: Medical

## 2020-03-24 ENCOUNTER — Ambulatory Visit (INDEPENDENT_AMBULATORY_CARE_PROVIDER_SITE_OTHER): Payer: Medicare Other | Admitting: Medical

## 2020-03-24 ENCOUNTER — Other Ambulatory Visit: Payer: Self-pay

## 2020-03-24 VITALS — BP 122/82 | HR 65 | Resp 18 | Ht 70.0 in | Wt 199.6 lb

## 2020-03-24 DIAGNOSIS — E039 Hypothyroidism, unspecified: Secondary | ICD-10-CM | POA: Diagnosis not present

## 2020-03-24 DIAGNOSIS — I10 Essential (primary) hypertension: Secondary | ICD-10-CM | POA: Diagnosis not present

## 2020-03-24 DIAGNOSIS — E785 Hyperlipidemia, unspecified: Secondary | ICD-10-CM

## 2020-03-24 DIAGNOSIS — Z125 Encounter for screening for malignant neoplasm of prostate: Secondary | ICD-10-CM | POA: Diagnosis not present

## 2020-03-24 DIAGNOSIS — E782 Mixed hyperlipidemia: Secondary | ICD-10-CM

## 2020-03-24 LAB — COMPREHENSIVE METABOLIC PANEL
ALT: 14 U/L (ref 0–53)
AST: 20 U/L (ref 0–37)
Albumin: 4.3 g/dL (ref 3.5–5.2)
Alkaline Phosphatase: 72 U/L (ref 39–117)
BUN: 18 mg/dL (ref 6–23)
CO2: 29 mEq/L (ref 19–32)
Calcium: 10 mg/dL (ref 8.4–10.5)
Chloride: 104 mEq/L (ref 96–112)
Creatinine, Ser: 1.22 mg/dL (ref 0.40–1.50)
GFR: 59.61 mL/min — ABNORMAL LOW (ref >60.00)
Glucose, Bld: 83 mg/dL (ref 70–99)
Potassium: 4.3 mEq/L (ref 3.5–5.1)
Sodium: 138 mEq/L (ref 135–145)
Total Bilirubin: 0.4 mg/dL (ref 0.2–1.2)
Total Protein: 7.2 g/dL (ref 6.0–8.3)

## 2020-03-24 LAB — LIPID PANEL
Cholesterol: 165 mg/dL (ref 0–200)
HDL: 54 mg/dL (ref >39.00)
LDL Cholesterol: 89 mg/dL (ref 0–99)
NonHDL: 111.44
Total CHOL/HDL Ratio: 3
Triglycerides: 113 mg/dL (ref 0.0–149.0)
VLDL: 22.6 mg/dL (ref 0.0–40.0)

## 2020-03-24 LAB — PSA: PSA: 0.95 ng/mL (ref 0.10–4.00)

## 2020-03-24 LAB — TSH: TSH: 3.23 u[IU]/mL (ref 0.35–4.50)

## 2020-03-24 MED ORDER — ATORVASTATIN CALCIUM 20 MG PO TABS: 20.0000 mg | ORAL_TABLET | Freq: Every day | ORAL | 3 refills | Status: DC

## 2020-03-24 MED ORDER — SERTRALINE HCL 100 MG PO TABS: 100.0000 mg | ORAL_TABLET | Freq: Two times a day (BID) | ORAL | 3 refills | Status: DC

## 2020-03-24 MED ORDER — ALBUTEROL SULFATE HFA 108 (90 BASE) MCG/ACT IN AERS: 2.0000 | INHALATION_SPRAY | Freq: Four times a day (QID) | RESPIRATORY_TRACT | 5 refills | Status: DC | PRN

## 2020-03-24 MED ORDER — LEVOTHYROXINE SODIUM 137 MCG PO TABS: 137.0000 ug | ORAL_TABLET | Freq: Every day | ORAL | 3 refills | Status: DC

## 2020-03-24 MED ORDER — LORATADINE 10 MG PO TABS: 10.0000 mg | ORAL_TABLET | Freq: Every day | ORAL | 3 refills | Status: DC

## 2020-03-24 MED ORDER — QVAR REDIHALER 80 MCG/ACT IN AERB: 1.0000 | INHALATION_SPRAY | Freq: Two times a day (BID) | RESPIRATORY_TRACT | 5 refills | Status: DC

## 2020-03-24 MED ORDER — LOSARTAN POTASSIUM-HCTZ 50-12.5 MG PO TABS: 1.0000 | ORAL_TABLET | Freq: Every day | ORAL | 3 refills | Status: DC

## 2020-03-24 MED ORDER — MONTELUKAST SODIUM 10 MG PO TABS: 10.0000 mg | ORAL_TABLET | Freq: Every day | ORAL | 3 refills | Status: DC

## 2020-03-24 NOTE — Progress Notes (Signed)
Subjective:    Patient ID: Robert Hobbs, male    DOB: 1956-01-23, 64 y.o.   MRN: [id number]  HPI  Pt in for check up.  He needs prescription. He does 90 day supplies. He has few tablets left. He works in [place].   Pt bp is well controlled today.  Hx of low thyroid. Last twas normal.  Pt states asthma stable/breathing well.  Pt states his mood is well controlled.  No urinary symptoms but explained could order screening psa labs and he agreed. Discussed medicare rules on some screening test. No symptoms reported today.    Review of Systems  Constitutional: Negative for chills, fatigue and fever.  Respiratory: Negative for cough, chest tightness, shortness of breath and wheezing.   Cardiovascular: Negative for chest pain and palpitations.  Gastrointestinal: Negative for abdominal pain.  Musculoskeletal: Negative for back pain and myalgias.  Skin: Negative for rash.  Neurological: Negative for dizziness, syncope, weakness, light-headedness, numbness and headaches.  Hematological: Negative for adenopathy. Does not bruise/bleed easily.  Psychiatric/Behavioral: Negative for behavioral problems, confusion, sleep disturbance and suicidal ideas. The patient is not nervous/anxious.     Past Medical History:  Diagnosis Date  . Aortic stenosis   . Hypertension   . Mitral valve prolapse 02/24/2014     Social History   Socioeconomic History  . Marital status: Married    Spouse name: Not on file  . Number of children: Not on file  . Years of education: Not on file  . Highest education level: Not on file  Occupational History  . Not on file  Tobacco Use  . Smoking status: Never Smoker  . Smokeless tobacco: Never Used  Substance and Sexual Activity  . Alcohol use: No  . Drug use: No  . Sexual activity: Not on file  Other Topics Concern  . Not on file  Social History Narrative   He is separated   He has a 70 year old  (daughter) and 55 yr old (daughter) 52 yr old  (daughter) and 71 year (son) 48 year old daughter   Long distance truck driver   Enjoys biking but does not have much time to do that   Social Determinants of Corporate investment banker Strain:   . Difficulty of Paying Living Expenses:   Food Insecurity:   . Worried About Programme researcher, broadcasting/film/video in the Last Year:   . Barista in the Last Year:   Transportation Needs:   . Freight forwarder (Medical):   Marland Kitchen Lack of Transportation (Non-Medical):   Physical Activity:   . Days of Exercise per Week:   . Minutes of Exercise per Session:   Stress:   . Feeling of Stress :   Social Connections:   . Frequency of Communication with Friends and Family:   . Frequency of Social Gatherings with Friends and Family:   . Attends Religious Services:   . Active Member of Clubs or Organizations:   . Attends Banker Meetings:   Marland Kitchen Marital Status:   Intimate Partner Violence:   . Fear of Current or Ex-Partner:   . Emotionally Abused:   Marland Kitchen Physically Abused:   . Sexually Abused:     Past Surgical History:  Procedure Laterality Date  . ARTHROSCOPIC REPAIR ACL Left 1996  . ORIF HUMERUS FRACTURE Right 1978    Family History  Problem Relation Age of Onset  . Anxiety disorder Mother   . Arrhythmia Mother        "  died in her sleep" ? MI  . Alzheimer's disease Father   . Obesity Sister   . Prostate cancer Brother     Allergies  Allergen Reactions  . Cleocin [Clindamycin Hcl] Nausea And Vomiting  . Shellfish Allergy     Current Outpatient Medications on File Prior to Visit  Medication Sig Dispense Refill  . albuterol (VENTOLIN HFA) 108 (90 Base) MCG/ACT inhaler Inhale 2 puffs into the lungs every 6 (six) hours as needed for wheezing or shortness of breath. 6.7 g 5  . atorvastatin (LIPITOR) 20 MG tablet Take 1 tablet (20 mg total) by mouth daily. 90 tablet 1  . beclomethasone (QVAR REDIHALER) 80 MCG/ACT inhaler Inhale 1 puff into the lungs 2 (two) times daily. 10.6 g 5  .  levothyroxine (SYNTHROID) 137 MCG tablet Take 1 tablet (137 mcg total) by mouth daily before breakfast. 90 tablet 1  . loratadine (CLARITIN) 10 MG tablet Take 1 tablet (10 mg total) by mouth daily. 30 tablet 11  . losartan-hydrochlorothiazide (HYZAAR) 50-12.5 MG tablet Take 1 tablet by mouth daily. 90 tablet 1  . montelukast (SINGULAIR) 10 MG tablet Take 1 tablet (10 mg total) by mouth at bedtime. 90 tablet 1  . sertraline (ZOLOFT) 100 MG tablet Take 1 tablet (100 mg total) by mouth 2 (two) times daily. 180 tablet 1   No current facility-administered medications on file prior to visit.    BP 122/82   Pulse 65   Resp 18   Ht 5\' 10"  (1.778 m)   Wt 199 lb 9.6 oz (90.5 kg)   SpO2 98%   BMI 28.64 kg/m       Objective:   Physical Exam  General Mental Status- Alert. General Appearance- Not in acute distress.   Skin General: Color- Normal Color. Moisture- Normal Moisture.  Neck Carotid Arteries- Normal color. Moisture- Normal Moisture. No carotid bruits. No JVD.  Chest and Lung Exam Auscultation: Breath Sounds:-Normal.  Cardiovascular Auscultation:Rythm- Regular. Murmurs & Other Heart Sounds:Auscultation of the heart reveals- No Murmurs.  Abdomen Inspection:-Inspeection Normal. Palpation/Percussion:Note:No mass. Palpation and Percussion of the abdomen reveal- Non Tender, Non Distended + BS, no rebound or guarding.  Neurologic Cranial Nerve exam:- CN III-XII intact(No nystagmus), symmetric smile. Strength:- 5/5 equal and symmetric strength both upper and lower extremities.      Assessment & Plan:  Nice to meet you.  Your bp is well controlled. Continue current bp meds. Gave refill today.   For hyperlipidemia, refilled your cholesterol medication.  Will get CMP and lipid panel today.  Low thyroid feels your levothyroxine and will get a TSH level.  psa for screening prostate cancer.  For history of anxiety, I refilled your sertraline.  Asthma has been stable and  refilled your inhalers.  We will follow your lab results and let you know those results when they are in.  Follow-up as regular scheduled with your PCP or as needed.   , PA-C   Time spent with patient today was 27  minutes which consisted of chart review, discussing diagnosis, work up, treatment, refilling meds  and documentation.

## 2020-03-24 NOTE — Patient Instructions (Addendum)
Nice to meet you.  Your bp is well controlled. Continue current bp meds. Gave refill today.   For hyperlipidemia, refilled your cholesterol medication.  Will get CMP and lipid panel today.  Low thyroid feels your levothyroxine and will get a TSH level.  psa for screening prostate cancer.  For history of anxiety, I refilled your sertraline.  Asthma has been stable and refilled your inhalers.  We will follow your lab results and let you know those results when they are in.  Follow-up as regular scheduled with your PCP or as needed.

## 2021-01-25 ENCOUNTER — Other Ambulatory Visit: Payer: Self-pay

## 2021-01-25 ENCOUNTER — Ambulatory Visit (INDEPENDENT_AMBULATORY_CARE_PROVIDER_SITE_OTHER): Payer: Medicare Other | Admitting: Medical

## 2021-01-25 ENCOUNTER — Telehealth: Payer: Self-pay | Admitting: Medical

## 2021-01-25 VITALS — BP 120/70 | HR 73 | Resp 18 | Ht 70.0 in | Wt 193.0 lb

## 2021-01-25 DIAGNOSIS — E782 Mixed hyperlipidemia: Secondary | ICD-10-CM | POA: Diagnosis not present

## 2021-01-25 DIAGNOSIS — E039 Hypothyroidism, unspecified: Secondary | ICD-10-CM

## 2021-01-25 DIAGNOSIS — I10 Essential (primary) hypertension: Secondary | ICD-10-CM

## 2021-01-25 DIAGNOSIS — E785 Hyperlipidemia, unspecified: Secondary | ICD-10-CM

## 2021-01-25 DIAGNOSIS — F419 Anxiety disorder, unspecified: Secondary | ICD-10-CM | POA: Diagnosis not present

## 2021-01-25 DIAGNOSIS — Z7185 Encounter for immunization safety counseling: Secondary | ICD-10-CM

## 2021-01-25 MED ORDER — LOSARTAN POTASSIUM-HCTZ 50-12.5 MG PO TABS: 1.0000 | ORAL_TABLET | Freq: Every day | ORAL | 3 refills | Status: DC

## 2021-01-25 MED ORDER — QVAR REDIHALER 80 MCG/ACT IN AERB: 1.0000 | INHALATION_SPRAY | Freq: Two times a day (BID) | RESPIRATORY_TRACT | 5 refills | Status: DC

## 2021-01-25 MED ORDER — LORATADINE 10 MG PO TABS: 10.0000 mg | ORAL_TABLET | Freq: Every day | ORAL | 3 refills | Status: AC

## 2021-01-25 MED ORDER — FLUTICASONE PROPIONATE 50 MCG/ACT NA SUSP: 2.0000 | Freq: Every day | NASAL | 1 refills | Status: DC

## 2021-01-25 MED ORDER — ALBUTEROL SULFATE HFA 108 (90 BASE) MCG/ACT IN AERS: 2.0000 | INHALATION_SPRAY | Freq: Four times a day (QID) | RESPIRATORY_TRACT | 5 refills | Status: DC | PRN

## 2021-01-25 MED ORDER — SERTRALINE HCL 100 MG PO TABS: 100.0000 mg | ORAL_TABLET | Freq: Two times a day (BID) | ORAL | 3 refills | Status: DC

## 2021-01-25 MED ORDER — MONTELUKAST SODIUM 10 MG PO TABS: 10.0000 mg | ORAL_TABLET | Freq: Every day | ORAL | 3 refills | Status: DC

## 2021-01-25 NOTE — Progress Notes (Signed)
Subjective:    Patient ID: Robert Hobbs, male    DOB: 1955/11/13, 65 y.o.   MRN: [id number]  HPI  Pt in for follow up.  Pt needs refill of atorvastatin. He is about to run out next month. Pt is fasting.  Pt has htn. bp controlled. Losartan-hctz.  Hx of hypothyroid. Last tsh 10 months ago.  Last psa normal.   Pt states moderate well hydrated.  Hx of anxiety. Well controlled with sertraline 200 mg daily.   History of allergies Pt states claritin not helping like it used to. Also on montelukast.  Review of Systems  Constitutional: Negative for chills, fatigue and fever.  HENT: Negative for congestion and drooling.   Respiratory: Negative for cough, chest tightness, shortness of breath and wheezing.   Cardiovascular: Negative for chest pain and palpitations.  Gastrointestinal: Negative for abdominal pain.  Musculoskeletal: Negative for back pain and myalgias.  Skin: Negative for rash.  Neurological: Negative for dizziness, syncope, weakness, numbness and headaches.  Hematological: Negative for adenopathy. Does not bruise/bleed easily.  Psychiatric/Behavioral: Negative for behavioral problems, decreased concentration and suicidal ideas. The patient is not nervous/anxious.      Past Medical History:  Diagnosis Date  . Aortic stenosis   . Hypertension   . Mitral valve prolapse 02/24/2014     Social History   Socioeconomic History  . Marital status: Married    Spouse name: Not on file  . Number of children: Not on file  . Years of education: Not on file  . Highest education level: Not on file  Occupational History  . Not on file  Tobacco Use  . Smoking status: Never Smoker  . Smokeless tobacco: Never Used  Substance and Sexual Activity  . Alcohol use: No  . Drug use: No  . Sexual activity: Not on file  Other Topics Concern  . Not on file  Social History Narrative   He is separated   He has a 8 year old  (daughter) and 30 yr old (daughter) 30 yr old  (daughter) and 70 year (son) 62 year old daughter   Long distance truck driver   Enjoys biking but does not have much time to do that   Social Determinants of Corporate investment banker Strain: Not on file  Food Insecurity: Not on file  Transportation Needs: Not on file  Physical Activity: Not on file  Stress: Not on file  Social Connections: Not on file  Intimate Partner Violence: Not on file    Past Surgical History:  Procedure Laterality Date  . ARTHROSCOPIC REPAIR ACL Left 1996  . ORIF HUMERUS FRACTURE Right 1978    Family History  Problem Relation Age of Onset  . Anxiety disorder Mother   . Arrhythmia Mother        "died in her sleep" ? MI  . Alzheimer's disease Father   . Obesity Sister   . Prostate cancer Brother     Allergies  Allergen Reactions  . Cleocin [Clindamycin Hcl] Nausea And Vomiting  . Shellfish Allergy     Current Outpatient Medications on File Prior to Visit  Medication Sig Dispense Refill  . albuterol (VENTOLIN HFA) 108 (90 Base) MCG/ACT inhaler Inhale 2 puffs into the lungs every 6 (six) hours as needed for wheezing or shortness of breath. 6.7 g 5  . atorvastatin (LIPITOR) 20 MG tablet Take 1 tablet (20 mg total) by mouth daily. 90 tablet 3  . beclomethasone (QVAR REDIHALER) 80 MCG/ACT inhaler Inhale  1 puff into the lungs 2 (two) times daily. 10.6 g 5  . levothyroxine (SYNTHROID) 137 MCG tablet Take 1 tablet (137 mcg total) by mouth daily before breakfast. 90 tablet 3  . loratadine (CLARITIN) 10 MG tablet Take 1 tablet (10 mg total) by mouth daily. 90 tablet 3  . losartan-hydrochlorothiazide (HYZAAR) 50-12.5 MG tablet Take 1 tablet by mouth daily. 90 tablet 3  . montelukast (SINGULAIR) 10 MG tablet Take 1 tablet (10 mg total) by mouth at bedtime. 90 tablet 3  . sertraline (ZOLOFT) 100 MG tablet Take 1 tablet (100 mg total) by mouth 2 (two) times daily. 180 tablet 3   No current facility-administered medications on file prior to visit.    BP  120/70   Pulse 73   Resp 18   Ht 5\' 10"  (1.778 m)   Wt 193 lb (87.5 kg)   SpO2 96%   BMI 27.69 kg/m       Objective:   Physical Exam   General Mental Status- Alert. General Appearance- Not in acute distress.   Skin General: Color- Normal Color. Moisture- Normal Moisture.  Neck Carotid Arteries- Normal color. Moisture- Normal Moisture. No carotid bruits. No JVD.  Chest and Lung Exam Auscultation: Breath Sounds:-Normal.  Cardiovascular Auscultation:Rythm- Regular. Murmurs & Other Heart Sounds:Auscultation of the heart reveals- No Murmurs.  Abdomen Inspection:-Inspeection Normal. Palpation/Percussion:Note:No mass. Palpation and Percussion of the abdomen reveal- Non Tender, Non Distended + BS, no rebound or guarding.   Neurologic Cranial Nerve exam:- CN III-XII intact(No nystagmus), symmetric smile. Strength:- 5/5 equal and symmetric strength both upper and lower extremities.      Assessment & Plan:  Your bp is well controlled continue losartan-hctz. Will get cmp today.  For high cholesterol will get lipid panel. Then refill based on levels.  For hypothyroid get tsh. Refill med when lab reviewed.  For anxiety continue sertraline 200 mg daily.  For allergies continue claritin and montelukast. Recommend that you try flonase in morning and can irrigate nose with saline at night.  Vaccine counseling on covid given.   Follow up 6 months or as needed  Pt declines gi referral for colon ca. But would consider coloquard. 1/2 bother colon ca. Sent question to Dr. .

## 2021-01-25 NOTE — Telephone Encounter (Signed)
Dr. Chales Abrahams,  Question on cologuard. This pt had normal colonoscopy more than 10 years ago. No polyps or other findings. Pt has no famliy hx of polyps but 1/2 brother who has colon cancer. Under this scenario is cologuard acceptable?  Thanks,  Esperanza Richters, PA-C

## 2021-01-25 NOTE — Patient Instructions (Addendum)
Your bp is well controlled continue losartan-hctz. Will get cmp today.  For high cholesterol will get lipid panel. Then refill based on levels.  For hypothyroid get tsh. Refill med when lab reviewed.  For anxiety continue sertraline 200 mg daily.  For allergies continue claritin and montelukast. Recommend that you try flonase in morning and can irrigate nose with saline at night.  Vaccine counseling on covid vaxccine given.   Follow up 6 months or as needed

## 2021-01-26 LAB — LIPID PANEL
Cholesterol: 150 mg/dL (ref 0–200)
HDL: 56 mg/dL (ref >39.00)
LDL Cholesterol: 73 mg/dL (ref 0–99)
NonHDL: 94.08
Total CHOL/HDL Ratio: 3
Triglycerides: 105 mg/dL (ref 0.0–149.0)
VLDL: 21 mg/dL (ref 0.0–40.0)

## 2021-01-26 LAB — COMPREHENSIVE METABOLIC PANEL
ALT: 13 U/L (ref 0–53)
AST: 18 U/L (ref 0–37)
Albumin: 4.6 g/dL (ref 3.5–5.2)
Alkaline Phosphatase: 72 U/L (ref 39–117)
BUN: 19 mg/dL (ref 6–23)
CO2: 27 mEq/L (ref 19–32)
Calcium: 9.7 mg/dL (ref 8.4–10.5)
Chloride: 103 mEq/L (ref 96–112)
Creatinine, Ser: 1.22 mg/dL (ref 0.40–1.50)
GFR: 62.04 mL/min (ref >60.00)
Glucose, Bld: 84 mg/dL (ref 70–99)
Potassium: 4.1 mEq/L (ref 3.5–5.1)
Sodium: 139 mEq/L (ref 135–145)
Total Bilirubin: 0.4 mg/dL (ref 0.2–1.2)
Total Protein: 7.6 g/dL (ref 6.0–8.3)

## 2021-01-26 LAB — TSH: TSH: 4.54 u[IU]/mL — ABNORMAL HIGH (ref 0.35–4.50)

## 2021-01-26 MED ORDER — ATORVASTATIN CALCIUM 20 MG PO TABS: 20.0000 mg | ORAL_TABLET | Freq: Every day | ORAL | 3 refills | Status: DC

## 2021-01-26 NOTE — Addendum Note (Signed)
Addended by: Gwenevere Abbot on: 01/26/2021 05:18 PM   Modules accepted: Orders

## 2021-01-27 ENCOUNTER — Other Ambulatory Visit (INDEPENDENT_AMBULATORY_CARE_PROVIDER_SITE_OTHER): Payer: Medicare Other

## 2021-01-27 ENCOUNTER — Telehealth: Payer: Self-pay | Admitting: Medical

## 2021-01-27 ENCOUNTER — Other Ambulatory Visit: Payer: Self-pay

## 2021-01-27 ENCOUNTER — Other Ambulatory Visit: Payer: Medicare Other

## 2021-01-27 DIAGNOSIS — E039 Hypothyroidism, unspecified: Secondary | ICD-10-CM

## 2021-01-27 LAB — T4, FREE: Free T4: 0.84 ng/dL (ref 0.60–1.60)

## 2021-01-27 MED ORDER — LEVOTHYROXINE SODIUM 137 MCG PO TABS: 137.0000 ug | ORAL_TABLET | Freq: Every day | ORAL | 3 refills | Status: DC

## 2021-01-27 NOTE — Addendum Note (Signed)
Addended by: Elita Boone E on: 01/27/2021 10:11 AM   Modules accepted: Orders

## 2021-01-27 NOTE — Telephone Encounter (Signed)
Rx sent 

## 2021-01-27 NOTE — Addendum Note (Signed)
Addended by: Maximino Sarin on: 01/27/2021 09:27 AM   Modules accepted: Orders

## 2021-02-01 ENCOUNTER — Telehealth: Payer: Self-pay | Admitting: Medical

## 2021-02-01 NOTE — Telephone Encounter (Signed)
Let pt know that Dr. Lyndel Safe state cologuard acceptable option for him. Call patient and coordinate the order for cologuard. 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 So make sure coloquard kit sent to correct address.

## 2021-02-02 NOTE — Telephone Encounter (Signed)
Called pt and lvm to return call.  

## 2021-02-03 NOTE — Telephone Encounter (Signed)
Called pt and lvm to return call.  

## 2021-02-03 NOTE — Telephone Encounter (Signed)
Spoke with pt , pt states he wants cologuard sent to address in chart . Order placed

## 2023-03-06 ENCOUNTER — Emergency Department (HOSPITAL_COMMUNITY): Payer: Medicare Other

## 2023-03-06 ENCOUNTER — Observation Stay (HOSPITAL_COMMUNITY)
Admission: EM | Admit: 2023-03-06 | Discharge: 2023-03-06 | Disposition: A | Discharge status: Home / Self Care | Payer: Medicare Other | Attending: Family Medicine | Admitting: Family Medicine

## 2023-03-06 ENCOUNTER — Encounter (HOSPITAL_COMMUNITY): Payer: Self-pay

## 2023-03-06 ENCOUNTER — Other Ambulatory Visit: Payer: Self-pay

## 2023-03-06 DIAGNOSIS — J45909 Unspecified asthma, uncomplicated: Secondary | ICD-10-CM | POA: Insufficient documentation

## 2023-03-06 DIAGNOSIS — R55 Syncope and collapse: Secondary | ICD-10-CM | POA: Diagnosis present

## 2023-03-06 DIAGNOSIS — Z79899 Other long term (current) drug therapy: Secondary | ICD-10-CM | POA: Insufficient documentation

## 2023-03-06 DIAGNOSIS — R42 Dizziness and giddiness: Secondary | ICD-10-CM | POA: Diagnosis not present

## 2023-03-06 DIAGNOSIS — M549 Dorsalgia, unspecified: Secondary | ICD-10-CM | POA: Insufficient documentation

## 2023-03-06 DIAGNOSIS — I10 Essential (primary) hypertension: Secondary | ICD-10-CM | POA: Insufficient documentation

## 2023-03-06 LAB — CBC WITH DIFFERENTIAL/PLATELET
Abs Immature Granulocytes: 0.01 10*3/uL (ref 0.00–0.07)
Basophils Absolute: 0.1 10*3/uL (ref 0.0–0.1)
Basophils Relative: 2 %
Eosinophils Absolute: 0.2 10*3/uL (ref 0.0–0.5)
Eosinophils Relative: 4 %
HCT: 45.8 % (ref 39.0–52.0)
Hemoglobin: 15.2 g/dL (ref 13.0–17.0)
Immature Granulocytes: 0 %
Lymphocytes Relative: 28 %
Lymphs Abs: 1.1 10*3/uL (ref 0.7–4.0)
MCH: 30.9 pg (ref 26.0–34.0)
MCHC: 33.2 g/dL (ref 30.0–36.0)
MCV: 93.1 fL (ref 80.0–100.0)
Monocytes Absolute: 0.3 10*3/uL (ref 0.1–1.0)
Monocytes Relative: 8 %
Neutro Abs: 2.4 10*3/uL (ref 1.7–7.7)
Neutrophils Relative %: 58 %
Platelets: 179 10*3/uL (ref 150–400)
RBC: 4.92 MIL/uL (ref 4.22–5.81)
RDW: 12.3 % (ref 11.5–15.5)
WBC: 4.2 10*3/uL (ref 4.0–10.5)
nRBC: 0 % (ref 0.0–0.2)

## 2023-03-06 LAB — BASIC METABOLIC PANEL
Anion gap: 7 (ref 5–15)
BUN: 16 mg/dL (ref 8–23)
CO2: 25 mmol/L (ref 22–32)
Calcium: 9.2 mg/dL (ref 8.9–10.3)
Chloride: 104 mmol/L (ref 98–111)
Creatinine, Ser: 1.3 mg/dL — ABNORMAL HIGH (ref 0.61–1.24)
GFR, Estimated: 60 mL/min — ABNORMAL LOW (ref >60)
Glucose, Bld: 100 mg/dL — ABNORMAL HIGH (ref 70–99)
Potassium: 4.6 mmol/L (ref 3.5–5.1)
Sodium: 136 mmol/L (ref 135–145)

## 2023-03-06 LAB — I-STAT CHEM 8, ED
BUN: 17 mg/dL (ref 8–23)
Calcium, Ion: 1.2 mmol/L (ref 1.15–1.40)
Chloride: 104 mmol/L (ref 98–111)
Creatinine, Ser: 1.3 mg/dL — ABNORMAL HIGH (ref 0.61–1.24)
Glucose, Bld: 95 mg/dL (ref 70–99)
HCT: 44 % (ref 39.0–52.0)
Hemoglobin: 15 g/dL (ref 13.0–17.0)
Potassium: 4.6 mmol/L (ref 3.5–5.1)
Sodium: 139 mmol/L (ref 135–145)
TCO2: 27 mmol/L (ref 22–32)

## 2023-03-06 LAB — HEPATIC FUNCTION PANEL
ALT: 17 U/L (ref 0–44)
AST: 30 U/L (ref 15–41)
Albumin: 3.7 g/dL (ref 3.5–5.0)
Alkaline Phosphatase: 55 U/L (ref 38–126)
Bilirubin, Direct: 0.2 mg/dL (ref 0.0–0.2)
Indirect Bilirubin: 0.7 mg/dL (ref 0.3–0.9)
Total Bilirubin: 0.9 mg/dL (ref 0.3–1.2)
Total Protein: 7 g/dL (ref 6.5–8.1)

## 2023-03-06 LAB — TROPONIN I (HIGH SENSITIVITY)
Troponin I (High Sensitivity): 70 ng/L — ABNORMAL HIGH (ref <18)
Troponin I (High Sensitivity): 58 ng/L — ABNORMAL HIGH (ref <18)

## 2023-03-06 LAB — TSH: TSH: 5.974 u[IU]/mL — ABNORMAL HIGH (ref 0.350–4.500)

## 2023-03-06 LAB — MAGNESIUM: Magnesium: 2.2 mg/dL (ref 1.7–2.4)

## 2023-03-06 LAB — LIPASE, BLOOD: Lipase: 53 U/L — ABNORMAL HIGH (ref 11–51)

## 2023-03-06 LAB — CBG MONITORING, ED: Glucose-Capillary: 102 mg/dL — ABNORMAL HIGH (ref 70–99)

## 2023-03-06 MED ORDER — LACTATED RINGERS IV BOLUS
500.0000 mL | Freq: Once | INTRAVENOUS | Status: AC
  Administered 2023-03-06: 500 mL via INTRAVENOUS

## 2023-03-06 MED ORDER — FENTANYL CITRATE PF 50 MCG/ML IJ SOSY
50.0000 ug | PREFILLED_SYRINGE | INTRAMUSCULAR | Status: DC | PRN
  Administered 2023-03-06 (×3): 50 ug via INTRAVENOUS
  Filled 2023-03-06 (×3): qty 1

## 2023-03-06 MED ORDER — AMLODIPINE BESYLATE 5 MG PO TABS: 5.0000 mg | ORAL_TABLET | Freq: Every day | ORAL | 0 refills | Status: DC

## 2023-03-06 MED ORDER — IOHEXOL 350 MG/ML SOLN
100.0000 mL | Freq: Once | INTRAVENOUS | Status: AC | PRN
  Administered 2023-03-06: 100 mL via INTRAVENOUS

## 2023-03-06 MED ORDER — ASPIRIN 81 MG PO CHEW: 324.0000 mg | CHEWABLE_TABLET | Freq: Once | ORAL | Status: DC

## 2023-03-06 MED ORDER — METHOCARBAMOL 500 MG PO TABS: 500.0000 mg | ORAL_TABLET | Freq: Three times a day (TID) | ORAL | 0 refills | Status: DC | PRN

## 2023-03-06 MED ORDER — MECLIZINE HCL 25 MG PO TABS: 25.0000 mg | ORAL_TABLET | Freq: Three times a day (TID) | ORAL | 0 refills | Status: DC | PRN

## 2023-03-06 MED ORDER — ATORVASTATIN CALCIUM 40 MG PO TABS: 40.0000 mg | ORAL_TABLET | Freq: Every day | ORAL | 0 refills | Status: DC

## 2023-03-06 MED ORDER — MECLIZINE HCL 25 MG PO TABS: 25.0000 mg | ORAL_TABLET | Freq: Once | ORAL | Status: DC

## 2023-03-06 NOTE — Discharge Summary (Signed)
Signed     Expand All Collapse All  DC SUMMARY    Patient: Robert Hobbs WGN:[id number] DOB: 04-04-1956 PCP: Sandford Craze, NP DOA: 03/06/2023 DOS: the patient was seen and examined on 03/06/2023 Primary service: Tyrone Nine, MD   Referring physician: Gloris Manchester, MD Reason for consult: back pain, near syncope   Assessment and Plan: 67yo M with hx HTN, HLD, thoracic aortic aneurysm who presented to the ED today with a couple complaints. He has back pain that appears to be musculoskeletal with left-sided thoracic paraspinal spasm tender by exam. He also had a 4-5 minute episode of vertigo last night and has a negative MR brain here.    History of aortic stenosis and ascending thoracic aortic aneurysm: TAA is stable on CTA today. No dissection. He is aware of the recommendation for surgical repair of the aneurysm and AV (has been recommended >5 years). While I believe he would benefit from an updated echo (q6 month echo's were recommended by Dr. Donata Clay in 2019), I don't see how this will change management as the patient does not desire a referral to CT surgery or to discuss AVR. History suggests a vertiginous aspect to symptoms last night. He's had no syncope, no anginal complaints, and no heart failure symptoms. In fact, he shows me work outs on his fitness app that are mostly > 5 miles. The patient does not wish to be admitted, feels better, and is reassured by our work up in the ED.   Recommend discharge home with:  - Meclizine prn - BP medication (e.g. norvasc 5mg ) dosed to reduce orthostatic symptoms which led to discontinuation in the past. - Restarting statin (e.g. atorvastatin) - Antispasmodic for back spasm (e.g. robaxin)  - Message sent to pt's prior PCP to hopefully facilitate expedited follow up. There may be a delay since he hasn't been under their care sine 2022.  - He needs to follow up there and have an echocardiogram performed as an outpatient. - Note since  observation order was signed to facilitate throughput prior to assessing the patient, I have placed the discharge orders. - For history of hypothyroidism formerly said to be on synthroid , he hasn't taken this in a year and denies symptoms of hypothyroidism. His TSH is only 5.974. He desires minimal pill burden and knows to follow up with his PCP for repeat testing.    HPI: Robert Hobbs is a 67 y.o. male with past medical history of HTN, AS, asthma, HLD, TAA, anxiety who presented to the ED with several complaints.    He reported walking as he usually does this past week but walked >7 miles in upper 90'sF heat and may have over done it. He had an episode last night where he was abruptly overcome with dizziness described as the room spinning that did not stop when he closed his eyes associated with nausea, no vomiting, that ultimately subsided on its own in 4-5 minutes. He did not feel like he was going to pass out, denies lightheadedness. This has not recurred. He denies chest pain, leg swelling, orthopnea, PND, and a full ROS otherwise. Hasn't taken any medications for > 1 year.    He reports a few days of pain in his midback between shoulder blades that worsens with positions, thought maybe he slept wrong. Some but not much improvement with ibuprofen 200mg , turmeric, and topical oils.    Review of Systems: As mentioned in the history of present illness. All other  systems reviewed and are negative.     Past Medical History:  Diagnosis Date   Aortic stenosis     Hypertension     Mitral valve prolapse 02/24/2014             Past Surgical History:  Procedure Laterality Date   ARTHROSCOPIC REPAIR ACL Left 1996   ORIF HUMERUS FRACTURE Right 1978        Social History:  reports that he has never smoked. He has never used smokeless tobacco. He reports that he does not drink alcohol and does not use drugs.   Allergies      Allergies  Allergen Reactions   Cleocin [Clindamycin Hcl]  Nausea And Vomiting   Shellfish Allergy               Family History  Problem Relation Age of Onset   Anxiety disorder Mother     Arrhythmia Mother          "died in her sleep" ? MI   Alzheimer's disease Father     Obesity Sister     Prostate cancer Brother                   Prior to Admission medications   Medication Sig Start Date End Date Taking? Authorizing Provider  albuterol (VENTOLIN HFA) 108 (90 Base) MCG/ACT inhaler Inhale 2 puffs into the lungs every 6 (six) hours as needed for wheezing or shortness of breath. 01/25/21     Saguier, Ramon Dredge, PA-C  atorvastatin (LIPITOR) 20 MG tablet Take 1 tablet (20 mg total) by mouth daily. 01/26/21     Saguier, Ramon Dredge, PA-C  beclomethasone (QVAR REDIHALER) 80 MCG/ACT inhaler Inhale 1 puff into the lungs 2 (two) times daily. 01/25/21     Saguier, Ramon Dredge, PA-C  fluticasone (FLONASE) 50 MCG/ACT nasal spray Place 2 sprays into both nostrils daily. 01/25/21     Saguier, Ramon Dredge, PA-C  levothyroxine (SYNTHROID) 137 MCG tablet Take 1 tablet (137 mcg total) by mouth daily before breakfast. 01/27/21     Saguier, Ramon Dredge, PA-C  loratadine (CLARITIN) 10 MG tablet Take 1 tablet (10 mg total) by mouth daily. 01/25/21     Saguier, Ramon Dredge, PA-C  losartan-hydrochlorothiazide (HYZAAR) 50-12.5 MG tablet Take 1 tablet by mouth daily. 01/25/21     Saguier, Ramon Dredge, PA-C  montelukast (SINGULAIR) 10 MG tablet Take 1 tablet (10 mg total) by mouth at bedtime. 01/25/21     Saguier, Ramon Dredge, PA-C  sertraline (ZOLOFT) 100 MG tablet Take 1 tablet (100 mg total) by mouth 2 (two) times daily. 01/25/21     Esperanza Richters, PA-C      Physical Exam:       Vitals:    03/06/23 0945 03/06/23 1000 03/06/23 1235 03/06/23 1342  BP: (!) 158/107 (!) 151/101 (!) 142/98    Pulse: 61 60 (!) 58    Resp: 13 16 14     Temp:       98 F (36.7 C)  TempSrc:          SpO2: 100% 100% 100%    Weight:          Height:          No distress, well-appearing, animated male  Clear, nonlabored Regular  borderline bradycardia, III/VI holosystolic murmur at RUSB, no rub or gallop, no edema or JVD Soft, NT, ND, +BS No deformities, good muscle bulk and tone.  Alert, oriented, CNs intact, no deficits.   Data Reviewed: CMP normal except Cr 1.3. CBC  normal. Troponin 70 > 58. Hgb confirmed to be normal x2.    ECG reviewed along with ECG from 2017, both showing stable NSR w/LVH suggested, T wave morphology unchanged, TWI in inferior leads unchanged.   CTA showed 5.2x5.2cm thoracic aneurysm stable from prior. Lungs clear. Cardiomegaly, AV calcifications. Included abdomen shows normal pancreas (lipase was 53, abd benign).   MRI brain shows no mass or stroke.    Tyrone Nine, MD 03/06/2023 4:13 PM   For on call review [email or web address].

## 2023-03-06 NOTE — Consult Note (Signed)
Initial Consultation Note   Patient: Robert Hobbs:[id number] DOB: 06/22/1956 PCP: Sandford Craze, NP DOA: 03/06/2023 DOS: the patient was seen and examined on 03/06/2023 Primary service: Tyrone Nine, MD  Referring physician: Gloris Manchester, MD Reason for consult: back pain, near syncope  Assessment and Plan: 67yo M with hx HTN, HLD, thoracic aortic aneurysm who presented to the ED today with a couple complaints. He has back pain that appears to be musculoskeletal with left-sided thoracic paraspinal spasm tender by exam. He also had a 4-5 minute episode of vertigo last night and has a negative MR brain here.   History of aortic stenosis and ascending thoracic aortic aneurysm: TAA is stable on CTA today. No dissection. He is aware of the recommendation for surgical repair of the aneurysm and AV (has been recommended >5 years). While I believe he would benefit from an updated echo (q6 month echo's were recommended by Dr. Donata Clay in 2019), I don't see how this will change management as the patient does not desire a referral to CT surgery or to discuss AVR. History suggests a vertiginous aspect to symptoms last night. He's had no syncope, no anginal complaints, and no heart failure symptoms. In fact, he shows me work outs on his fitness app that are mostly > 5 miles. The patient does not wish to be admitted, feels better, and is reassured by our work up in the ED.  Recommend discharge home with:  - Meclizine prn - BP medication (e.g. norvasc 5mg ) dosed to reduce orthostatic symptoms which led to discontinuation in the past. - Restarting statin (e.g. atorvastatin) - Antispasmodic for back spasm (e.g. robaxin)  - Message sent to pt's prior PCP to hopefully facilitate expedited follow up. There may be a delay since he hasn't been under their care sine 2022.  - He needs to follow up there and have an echocardiogram performed as an outpatient. - Note since observation order was signed to  facilitate throughput prior to assessing the patient, I have placed the discharge orders. - For history of hypothyroidism formerly said to be on synthroid , he hasn't taken this in a year and denies symptoms of hypothyroidism. His TSH is only 5.974. He desires minimal pill burden and knows to follow up with his PCP for repeat testing.   HPI: Robert Hobbs is a 67 y.o. male with past medical history of HTN, AS, asthma, HLD, TAA, anxiety who presented to the ED with several complaints.   He reported walking as he usually does this past week but walked >7 miles in upper 90'sF heat and may have over done it. He had an episode last night where he was abruptly overcome with dizziness described as the room spinning that did not stop when he closed his eyes associated with nausea, no vomiting, that ultimately subsided on its own in 4-5 minutes. He did not feel like he was going to pass out, denies lightheadedness. This has not recurred. He denies chest pain, leg swelling, orthopnea, PND, and a full ROS otherwise. Hasn't taken any medications for > 1 year.   He reports a few days of pain in his midback between shoulder blades that worsens with positions, thought maybe he slept wrong. Some but not much improvement with ibuprofen 200mg , turmeric, and topical oils.   Review of Systems: As mentioned in the history of present illness. All other systems reviewed and are negative. Past Medical History:  Diagnosis Date   Aortic stenosis    Hypertension  Mitral valve prolapse 02/24/2014   Past Surgical History:  Procedure Laterality Date   ARTHROSCOPIC REPAIR ACL Left 1996   ORIF HUMERUS FRACTURE Right 1978   Social History:  reports that he has never smoked. He has never used smokeless tobacco. He reports that he does not drink alcohol and does not use drugs.  Allergies  Allergen Reactions   Cleocin [Clindamycin Hcl] Nausea And Vomiting   Shellfish Allergy     Family History  Problem Relation  Age of Onset   Anxiety disorder Mother    Arrhythmia Mother        "died in her sleep" ? MI   Alzheimer's disease Father    Obesity Sister    Prostate cancer Brother     Prior to Admission medications   Medication Sig Start Date End Date Taking? Authorizing Provider  albuterol (VENTOLIN HFA) 108 (90 Base) MCG/ACT inhaler Inhale 2 puffs into the lungs every 6 (six) hours as needed for wheezing or shortness of breath. 01/25/21   Saguier, Ramon Dredge, PA-C  atorvastatin (LIPITOR) 20 MG tablet Take 1 tablet (20 mg total) by mouth daily. 01/26/21   Saguier, Ramon Dredge, PA-C  beclomethasone (QVAR REDIHALER) 80 MCG/ACT inhaler Inhale 1 puff into the lungs 2 (two) times daily. 01/25/21   Saguier, Ramon Dredge, PA-C  fluticasone (FLONASE) 50 MCG/ACT nasal spray Place 2 sprays into both nostrils daily. 01/25/21   Saguier, Ramon Dredge, PA-C  levothyroxine (SYNTHROID) 137 MCG tablet Take 1 tablet (137 mcg total) by mouth daily before breakfast. 01/27/21   Saguier, Ramon Dredge, PA-C  loratadine (CLARITIN) 10 MG tablet Take 1 tablet (10 mg total) by mouth daily. 01/25/21   Saguier, Ramon Dredge, PA-C  losartan-hydrochlorothiazide (HYZAAR) 50-12.5 MG tablet Take 1 tablet by mouth daily. 01/25/21   Saguier, Ramon Dredge, PA-C  montelukast (SINGULAIR) 10 MG tablet Take 1 tablet (10 mg total) by mouth at bedtime. 01/25/21   Saguier, Ramon Dredge, PA-C  sertraline (ZOLOFT) 100 MG tablet Take 1 tablet (100 mg total) by mouth 2 (two) times daily. 01/25/21   Esperanza Richters, PA-C    Physical Exam: Vitals:   03/06/23 0945 03/06/23 1000 03/06/23 1235 03/06/23 1342  BP: (!) 158/107 (!) 151/101 (!) 142/98   Pulse: 61 60 (!) 58   Resp: 13 16 14    Temp:    98 F (36.7 C)  TempSrc:      SpO2: 100% 100% 100%   Weight:      Height:      No distress, well-appearing, animated male  Clear, nonlabored Regular borderline bradycardia, III/VI holosystolic murmur at RUSB, no rub or gallop, no edema or JVD Soft, NT, ND, +BS No deformities, good muscle bulk and tone.  Alert,  oriented, CNs intact, no deficits.  Data Reviewed: CMP normal except Cr 1.3. CBC normal. Troponin 70 > 58. Hgb confirmed to be normal x2.   ECG reviewed along with ECG from 2017, both showing stable NSR w/LVH suggested, T wave morphology unchanged, TWI in inferior leads unchanged.  CTA showed 5.2x5.2cm thoracic aneurysm stable from prior. Lungs clear. Cardiomegaly, AV calcifications. Included abdomen shows normal pancreas (lipase was 53, abd benign).  MRI brain shows no mass or stroke.   Tyrone Nine, MD 03/06/2023 4:13 PM  For on call review [email or web address].

## 2023-03-06 NOTE — H&P (Addendum)
Signed     Expand All Collapse All  H&P    Patient: Robert Hobbs LKG:[id number] DOB: 1956/01/17 PCP: Sandford Craze, NP DOA: 03/06/2023 DOS: the patient was seen and examined on 03/06/2023 Primary service: Tyrone Nine, MD   Referring physician: Gloris Manchester, MD Reason for consult: back pain, near syncope   Assessment and Plan: 67yo M with hx HTN, HLD, thoracic aortic aneurysm who presented to the ED today with a couple complaints. He has back pain that appears to be musculoskeletal with left-sided thoracic paraspinal spasm tender by exam. He also had a 4-5 minute episode of vertigo last night and has a negative MR brain here.    History of aortic stenosis and ascending thoracic aortic aneurysm: TAA is stable on CTA today. No dissection. He is aware of the recommendation for surgical repair of the aneurysm and AV (has been recommended >5 years). While I believe he would benefit from an updated echo (q6 month echo's were recommended by Dr. Donata Clay in 2019), I don't see how this will change management as the patient does not desire a referral to CT surgery or to discuss AVR. History suggests a vertiginous aspect to symptoms last night. He's had no syncope, no anginal complaints, and no heart failure symptoms. In fact, he shows me work outs on his fitness app that are mostly > 5 miles. The patient does not wish to be admitted, feels better, and is reassured by our work up in the ED.   Recommend discharge home with:  - Meclizine prn - BP medication (e.g. norvasc 5mg ) dosed to reduce orthostatic symptoms which led to discontinuation in the past. - Restarting statin (e.g. atorvastatin) - Antispasmodic for back spasm (e.g. robaxin)  - Message sent to pt's prior PCP to hopefully facilitate expedited follow up. There may be a delay since he hasn't been under their care sine 2022.  - He needs to follow up there and have an echocardiogram performed as an outpatient. - Note since  observation order was signed to facilitate throughput prior to assessing the patient, I have placed the discharge orders. - For history of hypothyroidism formerly said to be on synthroid , he hasn't taken this in a year and denies symptoms of hypothyroidism. His TSH is only 5.974. He desires minimal pill burden and knows to follow up with his PCP for repeat testing.    HPI: Robert Hobbs is a 67 y.o. male with past medical history of HTN, AS, asthma, HLD, TAA, anxiety who presented to the ED with several complaints.    He reported walking as he usually does this past week but walked >7 miles in upper 90'sF heat and may have over done it. He had an episode last night where he was abruptly overcome with dizziness described as the room spinning that did not stop when he closed his eyes associated with nausea, no vomiting, that ultimately subsided on its own in 4-5 minutes. He did not feel like he was going to pass out, denies lightheadedness. This has not recurred. He denies chest pain, leg swelling, orthopnea, PND, and a full ROS otherwise. Hasn't taken any medications for > 1 year.    He reports a few days of pain in his midback between shoulder blades that worsens with positions, thought maybe he slept wrong. Some but not much improvement with ibuprofen 200mg , turmeric, and topical oils.    Review of Systems: As mentioned in the history of present illness. All other  systems reviewed and are negative.     Past Medical History:  Diagnosis Date   Aortic stenosis     Hypertension     Mitral valve prolapse 02/24/2014             Past Surgical History:  Procedure Laterality Date   ARTHROSCOPIC REPAIR ACL Left 1996   ORIF HUMERUS FRACTURE Right 1978        Social History:  reports that he has never smoked. He has never used smokeless tobacco. He reports that he does not drink alcohol and does not use drugs.   Allergies      Allergies  Allergen Reactions   Cleocin [Clindamycin Hcl]  Nausea And Vomiting   Shellfish Allergy               Family History  Problem Relation Age of Onset   Anxiety disorder Mother     Arrhythmia Mother          "died in her sleep" ? MI   Alzheimer's disease Father     Obesity Sister     Prostate cancer Brother                   Prior to Admission medications   Medication Sig Start Date End Date Taking? Authorizing Provider  albuterol (VENTOLIN HFA) 108 (90 Base) MCG/ACT inhaler Inhale 2 puffs into the lungs every 6 (six) hours as needed for wheezing or shortness of breath. 01/25/21     Saguier, Ramon Dredge, PA-C  atorvastatin (LIPITOR) 20 MG tablet Take 1 tablet (20 mg total) by mouth daily. 01/26/21     Saguier, Ramon Dredge, PA-C  beclomethasone (QVAR REDIHALER) 80 MCG/ACT inhaler Inhale 1 puff into the lungs 2 (two) times daily. 01/25/21     Saguier, Ramon Dredge, PA-C  fluticasone (FLONASE) 50 MCG/ACT nasal spray Place 2 sprays into both nostrils daily. 01/25/21     Saguier, Ramon Dredge, PA-C  levothyroxine (SYNTHROID) 137 MCG tablet Take 1 tablet (137 mcg total) by mouth daily before breakfast. 01/27/21     Saguier, Ramon Dredge, PA-C  loratadine (CLARITIN) 10 MG tablet Take 1 tablet (10 mg total) by mouth daily. 01/25/21     Saguier, Ramon Dredge, PA-C  losartan-hydrochlorothiazide (HYZAAR) 50-12.5 MG tablet Take 1 tablet by mouth daily. 01/25/21     Saguier, Ramon Dredge, PA-C  montelukast (SINGULAIR) 10 MG tablet Take 1 tablet (10 mg total) by mouth at bedtime. 01/25/21     Saguier, Ramon Dredge, PA-C  sertraline (ZOLOFT) 100 MG tablet Take 1 tablet (100 mg total) by mouth 2 (two) times daily. 01/25/21     Esperanza Richters, PA-C      Physical Exam:       Vitals:    03/06/23 0945 03/06/23 1000 03/06/23 1235 03/06/23 1342  BP: (!) 158/107 (!) 151/101 (!) 142/98    Pulse: 61 60 (!) 58    Resp: 13 16 14     Temp:       98 F (36.7 C)  TempSrc:          SpO2: 100% 100% 100%    Weight:          Height:          No distress, well-appearing, animated male  Clear, nonlabored Regular  borderline bradycardia, III/VI holosystolic murmur at RUSB, no rub or gallop, no edema or JVD Soft, NT, ND, +BS No deformities, good muscle bulk and tone.  Alert, oriented, CNs intact, no deficits.   Data Reviewed: CMP normal except Cr 1.3. CBC  normal. Troponin 70 > 58. Hgb confirmed to be normal x2.    ECG reviewed along with ECG from 2017, both showing stable NSR w/LVH suggested, T wave morphology unchanged, TWI in inferior leads unchanged.   CTA showed 5.2x5.2cm thoracic aneurysm stable from prior. Lungs clear. Cardiomegaly, AV calcifications. Included abdomen shows normal pancreas (lipase was 53, abd benign).   MRI brain shows no mass or stroke.    Tyrone Nine, MD 03/06/2023 4:13 PM   For on call review [email or web address].

## 2023-03-06 NOTE — ED Provider Notes (Signed)
[place] EMERGENCY DEPARTMENT AT [facility] Provider Note   CSN: [id number] Arrival date & time: 03/06/23  2952     History  Chief Complaint  Patient presents with   Chest Pain   Back Pain   Dizziness    Robert Hobbs is a 67 y.o. male.  HPI Patient presents for back pain and near syncopal symptoms.  Medical history includes HTN, aortic stenosis, asthma, HLD, thoracic aortic aneurysm, anxiety.  Thoracic aortic aneurysm measured 5 cm in 2020.  At the time, it was unchanged from 2 years prior. He typically walks for exercise. 2 days ago he walked 7 miles in the heat. HR at the time was elevated greater than it usually gets.  Starting last night, patient had thoracic back pain and near syncopal symptoms.  He describes dizziness, lightheadedness, nausea, that does worsen with positional changes.  Symptoms persisted today prompting him to come to the ED.  He denies any recent injuries.  He does have remote injuries with chronic back pain. He describes a mild L sided chest pressure.    Home Medications Prior to Admission medications   Medication Sig Start Date End Date Taking? Authorizing Provider  amLODipine (NORVASC) 5 MG tablet Take 1 tablet (5 mg total) by mouth daily. 03/06/23  Yes Tyrone Nine, MD  atorvastatin (LIPITOR) 40 MG tablet Take 1 tablet (40 mg total) by mouth daily. 03/06/23  Yes Tyrone Nine, MD  gatifloxacin (ZYMAXID) 0.5 % SOLN Place 1 drop into both eyes 4 (four) times daily. 03/05/23  Yes [provider]  ibuprofen (ADVIL) 200 MG tablet Take 400 mg by mouth as needed for moderate pain or mild pain.   Yes [provider]  ketorolac (ACULAR) 0.5 % ophthalmic solution Place 1 drop into both eyes 4 (four) times daily. 03/05/23  Yes [provider]  loratadine (CLARITIN) 10 MG tablet Take 1 tablet (10 mg total) by mouth daily. 01/25/21  Yes Saguier, Ramon Dredge, PA-C  methocarbamol (ROBAXIN) 500 MG tablet Take 1 tablet (500 mg total) by  mouth every 8 (eight) hours as needed for muscle spasms. 03/06/23  Yes Tyrone Nine, MD  prednisoLONE acetate (PRED FORTE) 1 % ophthalmic suspension Place 1 drop into both eyes every 2 (two) hours while awake. 03/05/23  Yes [provider]  meclizine (ANTIVERT) 25 MG tablet Take 1 tablet (25 mg total) by mouth 3 (three) times daily as needed for dizziness. 03/06/23   Tyrone Nine, MD      Allergies    Cleocin [clindamycin hcl] and Shellfish allergy    Review of Systems   Review of Systems  Cardiovascular:  Positive for chest pain.  Gastrointestinal:  Positive for nausea.  Musculoskeletal:  Positive for back pain.  Neurological:  Positive for dizziness and light-headedness.  All other systems reviewed and are negative.   Physical Exam Updated Vital Signs BP (!) 142/98   Pulse (!) 58   Temp 98 F (36.7 C)   Resp 14   Ht 5\' 10"  (1.778 m)   Wt 83 kg   SpO2 100%   BMI 26.26 kg/m  Physical Exam Vitals and nursing note reviewed.  Constitutional:      General: He is not in acute distress.    Appearance: He is well-developed. He is not ill-appearing, toxic-appearing or diaphoretic.  HENT:     Head: Normocephalic and atraumatic.  Eyes:     Conjunctiva/sclera: Conjunctivae normal.  Cardiovascular:     Rate and Rhythm:  Normal rate and regular rhythm.     Heart sounds: Murmur heard.  Pulmonary:     Effort: Pulmonary effort is normal. No tachypnea or respiratory distress.     Breath sounds: Normal breath sounds. No decreased breath sounds, wheezing, rhonchi or rales.  Chest:     Chest wall: No tenderness.  Abdominal:     Palpations: Abdomen is soft.     Tenderness: There is no abdominal tenderness.  Musculoskeletal:        General: No swelling. Normal range of motion.     Cervical back: Normal range of motion and neck supple.     Right lower leg: No edema.     Left lower leg: No edema.  Skin:    General: Skin is warm and dry.     Capillary Refill: Capillary refill  takes less than 2 seconds.     Coloration: Skin is not cyanotic or pale.  Neurological:     General: No focal deficit present.     Mental Status: He is alert and oriented to person, place, and time.  Psychiatric:        Mood and Affect: Mood normal.        Behavior: Behavior normal.     ED Results / Procedures / Treatments   Labs (all labs ordered are listed, but only abnormal results are displayed) Labs Reviewed  BASIC METABOLIC PANEL - Abnormal; Notable for the following components:      Result Value   Glucose, Bld 100 (*)    Creatinine, Ser 1.30 (*)    GFR, Estimated 60 (*)    All other components within normal limits  LIPASE, BLOOD - Abnormal; Notable for the following components:   Lipase 53 (*)    All other components within normal limits  TSH - Abnormal; Notable for the following components:   TSH 5.974 (*)    All other components within normal limits  CBG MONITORING, ED - Abnormal; Notable for the following components:   Glucose-Capillary 102 (*)    All other components within normal limits  I-STAT CHEM 8, ED - Abnormal; Notable for the following components:   Creatinine, Ser 1.30 (*)    All other components within normal limits  TROPONIN I (HIGH SENSITIVITY) - Abnormal; Notable for the following components:   Troponin I (High Sensitivity) 70 (*)    All other components within normal limits  TROPONIN I (HIGH SENSITIVITY) - Abnormal; Notable for the following components:   Troponin I (High Sensitivity) 58 (*)    All other components within normal limits  MAGNESIUM  HEPATIC FUNCTION PANEL  CBC WITH DIFFERENTIAL/PLATELET    EKG EKG Interpretation Date/Time:  Wednesday March 06 2023 08:59:51 EDT Ventricular Rate:  63 PR Interval:  214 QRS Duration:  107 QT Interval:  435 QTC Calculation: 446 R Axis:   -50  Text Interpretation: Sinus rhythm Borderline prolonged PR interval Left anterior fascicular block Left ventricular hypertrophy Borderline T abnormalities,  lateral leads Confirmed by Gloris Manchester ([id number]) on 03/06/2023 9:39:44 AM  Radiology MR BRAIN WO CONTRAST  Result Date: 03/06/2023 CLINICAL DATA:  Neuro deficit, acute, stroke suspected. EXAM: MRI HEAD WITHOUT CONTRAST TECHNIQUE: Multiplanar, multiecho pulse sequences of the brain and surrounding structures were obtained without intravenous contrast. COMPARISON:  None Available. FINDINGS: The study is partially degraded by motion. Brain: No acute infarction, hemorrhage, hydrocephalus, extra-axial collection or mass lesion. Vascular: Normal flow voids. Skull and upper cervical spine: Normal marrow signal. Sinuses/Orbits: Opacification of the right maxillary sinus  with T1 hyperintense, T2 hypointense content. The orbits are maintained. Other: None. IMPRESSION: 1. No acute intracranial abnormality. 2. Right maxillary sinusitis. Electronically Signed   By: Baldemar Lenis M.D.   On: 03/06/2023 15:17   CT Angio Chest/Abd/Pel for Dissection W and/or Wo Contrast  Result Date: 03/06/2023 CLINICAL DATA:  Acute aortic syndrome suspected EXAM: CT ANGIOGRAPHY CHEST, ABDOMEN AND PELVIS TECHNIQUE: Non-contrast CT of the chest was initially obtained. Multidetector CT imaging through the chest, abdomen and pelvis was performed using the standard protocol during bolus administration of intravenous contrast. Multiplanar reconstructed images and MIPs were obtained and reviewed to evaluate the vascular anatomy. RADIATION DOSE REDUCTION: This exam was performed according to the departmental dose-optimization program which includes automated exposure control, adjustment of the mA and/or kV according to patient size and/or use of iterative reconstruction technique. CONTRAST:  OMNIPAQUE IOHEXOL 350 MG/ML SOLN COMPARISON:  CT chest angiogram, 03/25/2019 FINDINGS: CTA CHEST FINDINGS VASCULAR Aorta: Satisfactory opacification of the aorta. Dense aortic valve calcifications. Unchanged aneurysm of the tubular ascending  thoracic aorta measuring up to 5.2 x 5.2 cm. Normal caliber of the distal arch and descending thoracic aorta. No evidence of dissection, penetrating ulceration, or other acute aortic pathology. Cardiomegaly. Cardiovascular: No evidence of pulmonary embolism on limited non-tailored examination. Normal heart size. No pericardial effusion. Review of the MIP images confirms the above findings. NON VASCULAR Mediastinum/Nodes: No enlarged mediastinal, hilar, or axillary lymph nodes. Thyroid gland, trachea, and esophagus demonstrate no significant findings. Lungs/Pleura: Lungs are clear. No pleural effusion or pneumothorax. Musculoskeletal: No chest wall abnormality. No acute osseous findings. Review of the MIP images confirms the above findings. CTA ABDOMEN AND PELVIS FINDINGS VASCULAR Normal contour and caliber of the abdominal aorta. No evidence of aneurysm, dissection, or other acute aortic pathology. Mild, scattered aortic atherosclerosis. Duplicated bilateral renal arteries with otherwise standard branching pattern of the abdominal aorta. Review of the MIP images confirms the above findings. NON-VASCULAR Hepatobiliary: No solid liver abnormality is seen. No gallstones, gallbladder wall thickening, or biliary dilatation. Pancreas: Unremarkable. No pancreatic ductal dilatation or surrounding inflammatory changes. Spleen: Normal in size without significant abnormality. Adrenals/Urinary Tract: Adrenal glands are unremarkable. Kidneys are normal, without renal calculi, solid lesion, or hydronephrosis. Bladder is unremarkable. Stomach/Bowel: Stomach is within normal limits. Appendix appears normal. No evidence of bowel wall thickening, distention, or inflammatory changes. Lymphatic: No enlarged abdominal or pelvic lymph nodes. Reproductive: Mild prostatomegaly. Other: No abdominal wall hernia or abnormality. No ascites. Musculoskeletal: No acute osseous findings. IMPRESSION: 1. Unchanged aneurysm of the tubular ascending  thoracic aorta measuring up to 5.2 x 5.2 cm. No acute findings. Ascending thoracic aortic aneurysm. Recommend semi-annual imaging followup by CTA or MRA and referral to cardiothoracic surgery if not already obtained. This recommendation follows 2010 ACCF/AHA/AATS/ACR/ASA/SCA/SCAI/SIR/STS/SVM Guidelines for the Diagnosis and Management of Patients With Thoracic Aortic Disease. Circulation. 2010; 121: I433-I951. Aortic aneurysm NOS (ICD10-I71.9) 2. Normal contour and caliber of the abdominal aorta. No evidence of aneurysm, dissection, or other acute aortic pathology. 3. Dense aortic valve calcifications. Correlate for echocardiographic evidence of aortic valve dysfunction. 4. Cardiomegaly. Aortic Atherosclerosis (ICD10-I70.0). Electronically Signed   By: Jearld Lesch M.D.   On: 03/06/2023 11:27   DG Chest Portable 1 View  Result Date: 03/06/2023 CLINICAL DATA:  Known aortic aneurysm with vertigo EXAM: PORTABLE CHEST 1 VIEW COMPARISON:  CTA chest dated 03/25/2019 FINDINGS: Normal lung volumes. No focal consolidations. No pleural effusion or pneumothorax. Enlarged cardiomediastinal silhouette is likely projectional. Prominence of the upper  mediastinum, likely corresponding to known ascending aortic aneurysm. No acute osseous abnormality. IMPRESSION: 1. No active cardiopulmonary disease. 2. Prominence of the upper mediastinum, likely corresponding to known ascending aortic aneurysm. Electronically Signed   By: Agustin Cree M.D.   On: 03/06/2023 09:16    Procedures Procedures    Medications Ordered in ED Medications  fentaNYL (SUBLIMAZE) injection 50 mcg (50 mcg Intravenous Given 03/06/23 1723)  aspirin chewable tablet 324 mg (0 mg Oral Hold 03/06/23 1337)  meclizine (ANTIVERT) tablet 25 mg (has no administration in time range)  lactated ringers bolus 500 mL (0 mLs Intravenous Stopped 03/06/23 1223)  lactated ringers bolus 500 mL (0 mLs Intravenous Stopped 03/06/23 1223)  iohexol (OMNIPAQUE) 350 MG/ML injection  100 mL (100 mLs Intravenous Contrast Given 03/06/23 1108)    ED Course/ Medical Decision Making/ A&P                             Medical Decision Making Amount and/or Complexity of Data Reviewed Labs: ordered. Radiology: ordered.  Risk OTC drugs. Prescription drug management. Decision regarding hospitalization.   This patient presents to the ED for concern of near syncope, this involves an extensive number of treatment options, and is a complaint that carries with it a high risk of complications and morbidity.  The differential diagnosis includes arrhythmia, aortic syndrome, valvular dysfunction, dehydration, metabolic derangement, TIA, CVA   Co morbidities that complicate the patient evaluation  HTN, aortic stenosis, asthma, HLD, thoracic aortic aneurysm, anxiety   Additional history obtained:  Additional history obtained from N/A External records from outside source obtained and reviewed including EMR   Lab Tests:  I Ordered, and personally interpreted labs.  The pertinent results include: Baseline creatinine, normal electrolytes, slight elevation in lipase and troponin.  Repeat troponin showed downtrend.   Imaging Studies ordered:  I ordered imaging studies including x-ray, CTA dissection study, MRI brain I independently visualized and interpreted imaging which showed no acute findings I agree with the radiologist interpretation   Cardiac Monitoring: / EKG:  The patient was maintained on a cardiac monitor.  I personally viewed and interpreted the cardiac monitored which showed an underlying rhythm of: Sinus rhythm  Problem List / ED Course / Critical interventions / Medication management  Patient with history of thoracic aortic aneurysm presenting for back pain and near syncopal symptoms since last night.  Patient is well-appearing on arrival.  He describes area of back pain as thoracic area.  No tenderness is present on exam.  Current breathing is unlabored.  As  needed fentanyl was ordered for analgesia.  Patient was ordered IV fluids.  He was placed on bedside cardiac monitor.  Workup was initiated.  Lab work was notable for slight elevation in troponin.  Lipase is high-normal.  On CTA dissection study, known thoracic aortic aneurysm appears unchanged.  He does have calcifications around his aortic valve and aortic stenosis would explain his recent symptoms.  He does have a systolic murmur on cardiac auscultation.  An MRI was ordered to assess for possible CVA causing his ongoing dizziness.  Patient was initially agreeable for admission for syncope observation and echocardiogram.  Hospitalist came and evaluated him.  Patient informed hospitalist that he does not want to get admitted.  He did undergo his MRI which was negative for acute findings.  I spoke with the patient further.  He does now have improved symptoms.  He is agreeable to outpatient follow-up.  Cardiology referral  was ordered.  Patient was prescribed amlodipine to take daily as well as meclizine to take as needed.  He was discharged in stable condition. I ordered medication including IV fluids for hydration; fentanyl for analgesia Reevaluation of the patient after these medicines showed that the patient resolved I have reviewed the patients home medicines and have made adjustments as needed   Social Determinants of Health:  Has access to outpatient care        Final Clinical Impression(s) / ED Diagnoses Final diagnoses:  Dizziness    Rx / DC Orders ED Discharge Orders          Ordered    meclizine (ANTIVERT) 25 MG tablet  3 times daily PRN        03/06/23 1653    methocarbamol (ROBAXIN) 500 MG tablet  Every 8 hours PRN        03/06/23 1653    amLODipine (NORVASC) 5 MG tablet  Daily        03/06/23 1653    atorvastatin (LIPITOR) 40 MG tablet  Daily        03/06/23 1653    Ambulatory referral to Cardiology       Comments: If you have not heard from the Cardiology office within  the next 72 hours please call [phone].   03/06/23 1749              Gloris Manchester, MD 03/06/23 1755

## 2023-03-06 NOTE — ED Notes (Signed)
Patient transported to MRI 
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
## 2023-03-06 NOTE — Discharge Instructions (Addendum)
Please call to follow up with cardiology as soon as possible. You will need an echocardiogram.  - High blood pressure increases the risk of rupture of your aneurysm. Rupture of your aneurysm would be essentially 100% fatal. To lower your blood pressure (not too much) take norvasc 5mg  daily. If you tolerate this well you'll likely need to either add another medication or increase this dose.  - Start taking atorvastatin to reduce the risk of atherosclerosis damaging the walls of your aneurysm.  - You can take meclizine as needed for dizziness and robaxin as needed for muscle spasm.  - If your symptoms return/worsen, seek medical attention right away.

## 2023-03-07 ENCOUNTER — Telehealth: Payer: Self-pay

## 2023-03-07 NOTE — Transitions of Care (Post Inpatient/ED Visit) (Unsigned)
   03/07/2023  Name: TIEGAN TERPSTRA MRN: [id number] DOB: 12-20-1955  Today's TOC FU Call Status: Today's TOC FU Call Status:: Unsuccessul Call (1st Attempt) Unsuccessful Call (1st Attempt) Date: 03/07/23  Attempted to reach the patient regarding the most recent Inpatient/ED visit.  Follow Up Plan: Additional outreach attempts will be made to reach the patient to complete the Transitions of Care (Post Inpatient/ED visit) call.   Signature Karena Addison, LPN [facility] Nurse Health Advisor Direct Dial [phone]

## 2023-03-08 ENCOUNTER — Ambulatory Visit (INDEPENDENT_AMBULATORY_CARE_PROVIDER_SITE_OTHER): Payer: Medicare Other | Admitting: Family

## 2023-03-08 ENCOUNTER — Telehealth: Payer: Self-pay | Admitting: Family

## 2023-03-08 VITALS — BP 154/88 | HR 61 | Temp 97.6°F | Resp 16 | Wt 189.0 lb

## 2023-03-08 DIAGNOSIS — E038 Other specified hypothyroidism: Secondary | ICD-10-CM

## 2023-03-08 DIAGNOSIS — M546 Pain in thoracic spine: Secondary | ICD-10-CM | POA: Diagnosis not present

## 2023-03-08 DIAGNOSIS — I10 Essential (primary) hypertension: Secondary | ICD-10-CM | POA: Diagnosis not present

## 2023-03-08 DIAGNOSIS — I350 Nonrheumatic aortic (valve) stenosis: Secondary | ICD-10-CM

## 2023-03-08 DIAGNOSIS — J320 Chronic maxillary sinusitis: Secondary | ICD-10-CM | POA: Insufficient documentation

## 2023-03-08 DIAGNOSIS — I7121 Aneurysm of the ascending aorta, without rupture: Secondary | ICD-10-CM

## 2023-03-08 MED ORDER — LEVOTHYROXINE SODIUM 50 MCG PO TABS
50.0000 ug | ORAL_TABLET | Freq: Every day | ORAL | 0 refills | Status: DC
Start: 2023-03-08 — End: 2023-04-16

## 2023-03-08 MED ORDER — AMLODIPINE BESYLATE 10 MG PO TABS: 10.0000 mg | ORAL_TABLET | Freq: Every day | ORAL | 0 refills | Status: DC

## 2023-03-08 NOTE — Assessment & Plan Note (Signed)
Noted on MRI.  Pt declines antibiotic treatment. He is focusing on some dental issues first.

## 2023-03-08 NOTE — Telephone Encounter (Signed)
Called patient but no answer, left voice mail for patient to call back.   

## 2023-03-08 NOTE — Progress Notes (Signed)
Subjective:     Patient ID: Robert Hobbs, male    DOB: 11/25/1955, 67 y.o.   MRN: [id number]  Chief Complaint  Patient presents with   Follow-up    Here for ed follow up   Dizziness    Doing a litter better with meclizine    HPI  Discussed the use of AI scribe software for clinical note transcription with the patient, who gave verbal consent to proceed.  History of Present Illness   The patient is a 67 year old male with a history of hypertension, aortic stenosis  and aneurysm of the ascending thoracic aorta. He presents for a hospital follow-up after being admitted 7/17 with back pain and an episode of vertigo. During his hospital stay, a CTA of the chest, abdomen, and pelvis was performed, which showed no change in the size of the 5.2 cm aneurysm and dense aortic valve calcifications. 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 He also reports that he has only recently restarted his BP medications which he had not taken in some time. Last July he noticed that his blood pressure was "creeping up." He experienced a severe episode of vertigo in the middle of the night on 7/16 which he has never experienced before. This worried him.  The also reports feeling generally unwell and not feeling like himself.       RADIOLOGY  CTA chest, abdomen, and pelvis: Unchanged aneurysm of the tubular ascending thoracic aorta measuring up to 5.2 by 5.2 cm, dense aortic valve calcifications, cardiomegaly (03/06/2023) MRI brain: Right maxillary sinusitis, no other acute findings (03/06/2023)   Health Maintenance Due  Topic Date Due   Medicare Annual Wellness (AWV)  Never done   Colonoscopy  Never done   Zoster Vaccines- Shingrix (1 of 2) Never done   Pneumonia Vaccine 40+ Years old (1 of 1 - PCV) Never done   COVID-19 Vaccine (1 - 2023-24 season) Never done    Past Medical History:  Diagnosis Date   Aortic stenosis    Hypertension     Mitral valve prolapse 02/24/2014    Past Surgical History:  Procedure Laterality Date   ARTHROSCOPIC REPAIR ACL Left 1996   ORIF HUMERUS FRACTURE Right 1978    Family History  Problem Relation Age of Onset   Anxiety disorder Mother    Arrhythmia Mother        "died in her sleep" ? MI   Alzheimer's disease Father    Obesity Sister    Prostate cancer Brother     Social History   Socioeconomic History   Marital status: Married    Spouse name: Not on file   Number of children: Not on file   Years of education: Not on file   Highest education level: Not on file  Occupational History   Not on file  Tobacco Use   Smoking status: Never   Smokeless tobacco: Never  Substance and Sexual Activity   Alcohol use: No   Drug use: No   Sexual activity: Not on file  Other Topics Concern   Not on file  Social History Narrative   He is separated   He has a 80 year old  (daughter) and 79 yr old (daughter) 102 yr old (daughter) and 34 year (son) 65 year old daughter   Long distance truck driver   Enjoys biking but does not have much time to do that   Social Determinants of  Health   Financial Resource Strain: Not on file  Food Insecurity: Not on file  Transportation Needs: Not on file  Physical Activity: Not on file  Stress: No Stress Concern Present (07/18/2021)   Received from [facility], [facility] - Occupational Stress Questionnaire    Feeling of Stress : Not at all  Social Connections: Unknown (12/28/2021)   Received from [facility]   Social Network    Social Network: Not on file  Intimate Partner Violence: Unknown (11/20/2021)   Received [facility], [facility]   HITS    Physically Hurt: Not on file    Insult or Talk Down To: Not on file    Threaten Physical Harm: Not on file    Scream or Curse: Not on file    Outpatient Medications Prior to Visit  Medication Sig Dispense Refill   atorvastatin  (LIPITOR) 40 MG tablet Take 1 tablet (40 mg total) by mouth daily. 30 tablet 0   gatifloxacin (ZYMAXID) 0.5 % SOLN Place 1 drop into both eyes 4 (four) times daily.     ibuprofen (ADVIL) 200 MG tablet Take 400 mg by mouth as needed for moderate pain or mild pain.     ketorolac (ACULAR) 0.5 % ophthalmic solution Place 1 drop into both eyes 4 (four) times daily.     loratadine (CLARITIN) 10 MG tablet Take 1 tablet (10 mg total) by mouth daily. 90 tablet 3   meclizine (ANTIVERT) 25 MG tablet Take 1 tablet (25 mg total) by mouth 3 (three) times daily as needed for dizziness. 20 tablet 0   methocarbamol (ROBAXIN) 500 MG tablet Take 1 tablet (500 mg total) by mouth every 8 (eight) hours as needed for muscle spasms. 20 tablet 0   prednisoLONE acetate (PRED FORTE) 1 % ophthalmic suspension Place 1 drop into both eyes every 2 (two) hours while awake.     amLODipine (NORVASC) 5 MG tablet Take 1 tablet (5 mg total) by mouth daily. 30 tablet 0   No facility-administered medications prior to visit.    Allergies  Allergen Reactions   Cleocin [Clindamycin Hcl] Nausea And Vomiting   Shellfish Allergy     ROS See HPI    Objective:    Physical Exam Constitutional:      General: He is not in acute distress.    Appearance: He is well-developed.  HENT:     Head: Normocephalic and atraumatic.  Cardiovascular:     Rate and Rhythm: Normal rate and regular rhythm.     Heart sounds: Murmur heard.     Systolic murmur is present with a grade of 3/6.     Crescendo diastolic murmur is present with a grade of 3/4.  Pulmonary:     Effort: Pulmonary effort is normal. No respiratory distress.     Breath sounds: Normal breath sounds. No wheezing or rales.  Musculoskeletal:     Cervical back: No tenderness.     Thoracic back: Tenderness (left of spine) present. No swelling or deformity.     Lumbar back: No tenderness.     Right lower leg: 3+ Edema present.     Left lower leg: 3+ Edema present.  Skin:     General: Skin is warm and dry.  Neurological:     Mental Status: He is alert and oriented to person, place, and time.  Psychiatric:        Behavior: Behavior normal.  Thought Content: Thought content normal.      BP (!) 154/88   Pulse 61   Temp 97.6 F (36.4 C) (Oral)   Resp 16   Wt 189 lb (85.7 kg)   SpO2 100%   BMI 27.12 kg/m  Wt Readings from Last 3 Encounters:  03/08/23 189 lb (85.7 kg)  03/06/23 183 lb (83 kg)  01/25/21 193 lb (87.5 kg)       Assessment & Plan:   Problem List Items Addressed This Visit       Unprioritized   Subclinical hypothyroidism    Lab Results  Component Value Date   TSH 5.974 (H) 03/06/2023   Recent labs indicate underactive thyroid. -Start Levothyroxine daily. -Recheck thyroid function in six weeks.       Relevant Medications   levothyroxine (SYNTHROID) 50 MCG tablet   Essential hypertension    Elevated blood pressure readings, currently on Amlodipine 5mg  daily. Patient reports inconsistent medication adherence. -Increase Amlodipine to 10mg  daily. -Advise patient to monitor for ankle edema, a potential side effect of increased dosage.      Relevant Medications   amLODipine (NORVASC) 10 MG tablet   Chronic maxillary sinusitis    Noted on MRI.  Pt declines antibiotic treatment. He is focusing on some dental issues first.       Aortic stenosis - Primary    Will update 2D echo.  Could be a contributing factor to his recent dizziness episode.       Relevant Medications   amLODipine (NORVASC) 10 MG tablet   Other Relevant Orders   ECHOCARDIOGRAM COMPLETE   Ambulatory referral to Cardiology   Ambulatory referral to Cardiothoracic Surgery   Aortic aneurysm, thoracic (HCC)    This is a bulge in your aorta, the main blood vessel that carries blood from your heart to the rest of your body. We will order an echocardiogram to assess your aortic valve and refer you to [facility] for a surgical consultation at your  request.  He has been resistant to recommended surgical intervention to date.  We discussed increase risk of aortic dissection/death with aneurysms 5 cm and greater.  He would like to get an opinion from the [facility] clinic.  I have placed an order for consult with Si M. Pham MD in [facility] clinic.       Relevant Medications   amLODipine (NORVASC) 10 MG tablet   Other Relevant Orders   Ambulatory referral to Cardiothoracic Surgery   Other Visit Diagnoses     Thoracic back pain, unspecified back pain laterality, unspecified chronicity       Relevant Orders   Ambulatory referral to Physical Therapy      60 minutes spent on today's visit. Time was spent counseling patient on his above health conditions and researching cardiothoracic surgeons at [facility]   I have discontinued Tinnie Gens A. Kassner's amLODipine. I am also having him start on amLODipine and levothyroxine. Additionally, I am having him maintain his loratadine, ibuprofen, gatifloxacin, ketorolac, prednisoLONE acetate, meclizine, methocarbamol, and atorvastatin.  Meds ordered this encounter  Medications   amLODipine (NORVASC) 10 MG tablet    Sig: Take 1 tablet (10 mg total) by mouth daily.    Dispense:  90 tablet    Refill:  0    Order Specific Question:   Supervising Provider    Answer:   Danise Edge A [[id number]]   levothyroxine (SYNTHROID) 50 MCG tablet    Sig: Take 1 tablet (50 mcg total) by  mouth daily.    Dispense:  90 tablet    Refill:  0    Order Specific Question:   Supervising Provider    Answer:   Danise Edge A [[id number]]

## 2023-03-08 NOTE — Assessment & Plan Note (Addendum)
This is a bulge in your aorta, the main blood vessel that carries blood from your heart to the rest of your body. We will order an echocardiogram to assess your aortic valve and refer you to [facility] for a surgical consultation at your request.  He has been resistant to recommended surgical intervention to date.  We discussed increase risk of aortic dissection/death with aneurysms 5 cm and greater.  He would like to get an opinion from the [facility] clinic.  I have placed an order for consult with Si M. Pham MD in [facility] clinic.

## 2023-03-08 NOTE — Telephone Encounter (Signed)
Please advise pt that I found a surgeon in [place] with the [facility] clinic who does valve repairs and aortic aneurysm repairs.  His name is Si M. Pham MD.  We will send the referral to them.  If he does not hear back in a week he should contact them: [phone]

## 2023-03-08 NOTE — Assessment & Plan Note (Signed)
Lab Results  Component Value Date   TSH 5.974 (H) 03/06/2023   Recent labs indicate underactive thyroid. -Start Levothyroxine daily. -Recheck thyroid function in six weeks.

## 2023-03-08 NOTE — Patient Instructions (Signed)
VISIT SUMMARY:  During your recent visit, we discussed your ongoing health issues including your aortic aneurysm, high blood pressure, chronic back pain, underactive thyroid, and sinusitis. We also discussed your recent hospital stay due to back pain and an episode of vertigo.  YOUR PLAN:  -ASCENDING AORTIC ANEURYSM: This is a bulge in your aorta, the main blood vessel that carries blood from your heart to the rest of your body. We will order an echocardiogram to assess your aortic valve and refer you to [facility] for a surgical consultation.  -HYPERTENSION: This is high blood pressure. We will increase your Amlodipine dosage to 10mg  daily and advise you to monitor for ankle swelling, which can be a side effect of this medication.  -CHRONIC BACK PAIN: This is likely due to arthritis. We recommend taking over-the-counter Tylenol regularly for pain management and considering physical therapy for core strengthening and potential pain improvement.  -HYPOTHYROIDISM: This is an underactive thyroid. We will start you on Levothyroxine daily and recheck your thyroid function in six weeks.  -MAXILLARY SINUSITIS: This is inflammation in your sinuses. We will not treat this at the moment due to your dental issues.  INSTRUCTIONS:  Please continue to monitor your aortic aneurysm and blood pressure. Follow up in one month to assess your response to the medication changes and to review the results of your echocardiogram.

## 2023-03-08 NOTE — Assessment & Plan Note (Addendum)
Will update 2D echo.  Could be a contributing factor to his recent dizziness episode.

## 2023-03-08 NOTE — Assessment & Plan Note (Signed)
Elevated blood pressure readings, currently on Amlodipine 5mg  daily. Patient reports inconsistent medication adherence. -Increase Amlodipine to 10mg  daily. -Advise patient to monitor for ankle edema, a potential side effect of increased dosage.

## 2023-03-11 NOTE — Telephone Encounter (Signed)
Pt called back and I relayed the message regarding his referral.

## 2023-03-11 NOTE — Transitions of Care (Post Inpatient/ED Visit) (Signed)
   03/11/2023  Name: MICHAELJOHN BISS MRN: [id number] DOB: 15-Apr-1956  Today's TOC FU Call Status: Today's TOC FU Call Status:: Successful TOC FU Call Competed Unsuccessful Call (1st Attempt) Date: 03/07/23 [facility] FU Call Complete Date: 03/11/23  Transition Care Management Follow-up Telephone Call Date of Discharge: 03/06/23 Discharge Facility: [facility]) Type of Discharge: Inpatient Admission Primary Inpatient Discharge Diagnosis:: dizziness How have you been since you were released from the hospital?: Better Any questions or concerns?: No  Items Reviewed: Did you receive and understand the discharge instructions provided?: Yes Medications obtained,verified, and reconciled?: Yes (Medications Reviewed) Any new allergies since your discharge?: No Dietary orders reviewed?: Yes Do you have support at home?: No  Medications Reviewed Today: Medications Reviewed Today     Reviewed by Karena Addison, LPN (Licensed Practical Nurse) on 03/11/23 at 1632  Med List Status: <None>   Medication Order Taking? Sig Documenting Provider Last Dose Status Informant  amLODipine (NORVASC) 10 MG tablet [id number]  Take 1 tablet (10 mg total) by mouth daily. Sandford Craze, NP  Active   atorvastatin (LIPITOR) 40 MG tablet [id number]  Take 1 tablet (40 mg total) by mouth daily. Tyrone Nine, MD  Active   gatifloxacin (ZYMAXID) 0.5 % SOLN [id number] No Place 1 drop into both eyes 4 (four) times daily. [provider] Past Week Active Self, Pharmacy Records  ibuprofen (ADVIL) 200 MG tablet [id number] No Take 400 mg by mouth as needed for moderate pain or mild pain. [provider] 03/05/2023 Active Self, Pharmacy Records  ketorolac (ACULAR) 0.5 % ophthalmic solution [id number] No Place 1 drop into both eyes 4 (four) times daily. [provider] Past Week Active Self, Pharmacy Records  levothyroxine (SYNTHROID) 50 MCG tablet [id number]  Take 1 tablet (50 mcg total) by mouth daily.  Sandford Craze, NP  Active   loratadine (CLARITIN) 10 MG tablet [id number] No Take 1 tablet (10 mg total) by mouth daily. Marisue Brooklyn 03/05/2023 Active Self, Pharmacy Records  meclizine (ANTIVERT) 25 MG tablet [id number]  Take 1 tablet (25 mg total) by mouth 3 (three) times daily as needed for dizziness. Tyrone Nine, MD  Active   methocarbamol (ROBAXIN) 500 MG tablet [id number]  Take 1 tablet (500 mg total) by mouth every 8 (eight) hours as needed for muscle spasms. Tyrone Nine, MD  Active   prednisoLONE acetate (PRED FORTE) 1 % ophthalmic suspension [id number] No Place 1 drop into both eyes every 2 (two) hours while awake. [provider] Past Week Active Self, Pharmacy Records            Home Care and Equipment/Supplies: Were Home Health Services Ordered?: NA Any new equipment or medical supplies ordered?: NA  Functional Questionnaire: Do you need assistance with bathing/showering or dressing?: No Do you need assistance with meal preparation?: No Do you need assistance with eating?: No Do you have difficulty maintaining continence: No Do you need assistance with getting out of bed/getting out of a chair/moving?: No Do you have difficulty managing or taking your medications?: No  Follow up appointments reviewed: PCP Follow-up appointment confirmed?: Yes Date of PCP follow-up appointment?: 03/07/23 Specialist Hospital Follow-up appointment confirmed?: NA Do you need transportation to your follow-up appointment?: No Do you understand care options if your condition(s) worsen?: Yes-patient verbalized understanding    SIGNATURE Karena Addison, LPN [facility] Nurse Health Advisor Direct Dial [phone]

## 2023-03-15 ENCOUNTER — Telehealth: Payer: Self-pay | Admitting: Family

## 2023-03-15 NOTE — Telephone Encounter (Signed)
Pt came in office asking to speak with PCP about referral for dental issues they have spoken about. Pt states that medicare is able to pay so he wants to try and get everything done at once so they cover it. Please call pt and advise on next steps and what he needs to do to achieve this.

## 2023-03-18 NOTE — Telephone Encounter (Signed)
Patient needs referral to orthodontist for teeth removal that he needs to have done prior to any procedures at the [facility] clinic.

## 2023-03-18 NOTE — Telephone Encounter (Signed)
Lvm for patient to call back, trying to get mor information on this

## 2023-03-19 NOTE — Telephone Encounter (Signed)
Patient notified of this information and he verbalized understanding.

## 2023-03-19 NOTE — Telephone Encounter (Signed)
He will need to see a general dentist of choice. Orthodontists do braces not removal of teeth.  Medicare does not pay for removal of teeth.  I would recommend that he start with scheduling an appointment with a local dentist.

## 2023-03-21 ENCOUNTER — Ambulatory Visit (HOSPITAL_BASED_OUTPATIENT_CLINIC_OR_DEPARTMENT_OTHER)
Admission: RE | Admit: 2023-03-21 | Discharge: 2023-03-21 | Disposition: A | Discharge status: Home / Self Care | Payer: Medicare Other | Source: Ambulatory Visit | Attending: Family | Admitting: Family

## 2023-03-21 DIAGNOSIS — I350 Nonrheumatic aortic (valve) stenosis: Secondary | ICD-10-CM | POA: Insufficient documentation

## 2023-03-21 LAB — ECHOCARDIOGRAM COMPLETE
AR max vel: 0.87 cm2
AV Area VTI: 0.82 cm2
AV Area mean vel: 0.8 cm2
AV Mean grad: 49.4 mmHg
AV Peak grad: 73.2 mmHg
Ao pk vel: 4.28 m/s
Area-P 1/2: 2.49 cm2
MV VTI: 2.45 cm2
P 1/2 time: 469 msec
S' Lateral: 3.4 cm

## 2023-03-22 ENCOUNTER — Telehealth: Payer: Self-pay | Admitting: Family

## 2023-03-22 ENCOUNTER — Telehealth: Payer: Self-pay

## 2023-03-22 ENCOUNTER — Other Ambulatory Visit: Payer: Self-pay

## 2023-03-22 DIAGNOSIS — I10 Essential (primary) hypertension: Secondary | ICD-10-CM

## 2023-03-22 MED ORDER — METHOCARBAMOL 500 MG PO TABS: 500.0000 mg | ORAL_TABLET | Freq: Three times a day (TID) | ORAL | 0 refills | Status: DC | PRN

## 2023-03-22 NOTE — Telephone Encounter (Signed)
Please advise pt that his echo shows severe aortic stenosis and again notes his large aneurysm. The cardiologist who read the ultrasound was alarmed by these results and reached out to me and recommends cardiology consultation.  His office should be reaching out to him to schedule follow up. Please let me know if he does not hear from them.

## 2023-03-22 NOTE — Telephone Encounter (Signed)
Called pt, introduced Cardiology office and self. Advised that Dr. Bing Matter and Sandford Craze, NP had discussed his test results and wanted to see if he would make an appt to discuss his results with Dr. Bing Matter.  He refused and stated that he has an appt with the [facility] on the 16th of August to follow up on these results.

## 2023-03-22 NOTE — Telephone Encounter (Signed)
Pt came in asking for a refill of his muscle relaxer.Pt requests it be sent to pharmacy at [facility] on [address]. Pt also wants to make sure that his echo makes it to [place] clinic. Please call pt and advise on both refill and echocardiogram.

## 2023-03-22 NOTE — Telephone Encounter (Signed)
Amlodipine refilled earlier this month. Patient needed methocarvamol, rx sent

## 2023-03-22 NOTE — Telephone Encounter (Signed)
Patient notified of results and cardiologist recommendations. Patient reports he got a call from cardiology but he refused appointment with them due to having an appointment at the [facility] clinic in [place].

## 2023-03-22 NOTE — Telephone Encounter (Signed)
-----   Message from Gypsy Balsam sent at 03/22/2023 11:22 AM EDT ----- Molli Knock, thanks for all info.  Will try to reach out to him and see if he will be willing to, have a conversation about this ----- Message ----- From: Sandford Craze, NP Sent: 03/22/2023   7:44 AM EDT To: Georgeanna Lea, MD  Dear Molly Maduro,  I had a very long discussion with this patient about the seriousness of his conditions and need for surgical interventions.  He told me that he did not like Dr. Morton Peters and would not return to see him.  He told me that the only place that he would consider surgery would be the [facility]. I have referred him to Cardiothoracic Surgery at the [facility] in [facility].  He left [place] a message the other day that he will need to have dental extractions before they would consider any surgical procedures.  I advised him to follow up with his dentist.    It would be great if your office could reach out to him to schedule a cardiology visit please and reinforce what I told him.  However, I am not sure he will be willing to be seen.  Thank you,  Melissa ----- Message ----- From: Georgeanna Lea, MD Sent: 03/21/2023   5:03 PM EDT To: Sandford Craze, NP; Neena Rhymes, RN  Hi Melissa, this pt has severe AS and large thoracic aneurysm. Needs to be seen soon. Do you want me to make arrangements for appointment? Molly Maduro

## 2023-03-22 NOTE — Telephone Encounter (Signed)
Ok per Sandford Craze FNP, rx sent for methocarbamol

## 2023-03-22 NOTE — Telephone Encounter (Signed)
Prescription Request  03/22/2023  Is this a "Controlled Substance" medicine? No  LOV: 03/08/2023  What is the name of the medication or equipment? amLODipine (NORVASC) 10 MG tablet   Have you contacted your pharmacy to request a refill? No   Which pharmacy would you like this sent to?   [id number] [place], [place] - [address] [place] [address] Phone: [phone] Fax: [phone]    Patient notified that their request is being sent to the clinical staff for review and that they should receive a response within 2 business days.   Please advise at Mobile [phone] (mobile)

## 2023-03-25 ENCOUNTER — Telehealth: Payer: Self-pay | Admitting: Family

## 2023-03-25 MED ORDER — AMOXICILLIN 500 MG PO TABS: ORAL_TABLET | ORAL | 0 refills | Status: DC

## 2023-03-25 NOTE — Addendum Note (Signed)
Addended by: Sandford Craze on: 03/25/2023 04:22 PM   Modules accepted: Orders

## 2023-03-25 NOTE — Telephone Encounter (Signed)
Rx cancelled at [facility]

## 2023-03-25 NOTE — Telephone Encounter (Signed)
Rx has been sent to [facility].  I accidentally sent it to [facility] first- but left a voicemail to cancel at [facility].

## 2023-03-25 NOTE — Telephone Encounter (Signed)
Patient called to request antibiotic for upcoming dental work (Wednesday/Thursday). Please send to [facility] on [address]. Please call to advise if able to send in medication.

## 2023-03-26 ENCOUNTER — Telehealth: Payer: Self-pay | Admitting: Family

## 2023-03-26 NOTE — Telephone Encounter (Signed)
error 

## 2023-03-27 ENCOUNTER — Telehealth: Payer: Self-pay | Admitting: Family

## 2023-03-27 NOTE — Telephone Encounter (Signed)
Spoke with patient and he is meeting with an oral surgeon to have several teeth extracted.  The oral surgeon is requesting a letter clearing him medically for the procedure.  I have completed letter.  I think that benefit of removing teeth so he can proceed with medically necessary aneurysm/valve replacement surgeries outweighs any risks at this time.    Windell Moulding, I left the letter on your desk- can you please leave it at the front desk. Pt will come to pick it up.

## 2023-03-27 NOTE — Telephone Encounter (Signed)
Letter in cabinet up front for pick up

## 2023-03-27 NOTE — Telephone Encounter (Signed)
Pt called and requested to directly speak with Melissa because he needs a letter stating he needs a dental procedure. Please call and advise pt when can.

## 2023-04-08 ENCOUNTER — Ambulatory Visit (INDEPENDENT_AMBULATORY_CARE_PROVIDER_SITE_OTHER): Payer: Medicare Other | Admitting: Family

## 2023-04-08 VITALS — BP 123/85 | HR 75 | Temp 97.7°F | Resp 16 | Wt 189.0 lb

## 2023-04-08 DIAGNOSIS — I350 Nonrheumatic aortic (valve) stenosis: Secondary | ICD-10-CM

## 2023-04-08 DIAGNOSIS — I10 Essential (primary) hypertension: Secondary | ICD-10-CM

## 2023-04-08 DIAGNOSIS — R202 Paresthesia of skin: Secondary | ICD-10-CM | POA: Insufficient documentation

## 2023-04-08 DIAGNOSIS — J329 Chronic sinusitis, unspecified: Secondary | ICD-10-CM | POA: Diagnosis not present

## 2023-04-08 DIAGNOSIS — E038 Other specified hypothyroidism: Secondary | ICD-10-CM

## 2023-04-08 DIAGNOSIS — M545 Low back pain, unspecified: Secondary | ICD-10-CM | POA: Diagnosis not present

## 2023-04-08 DIAGNOSIS — I7121 Aneurysm of the ascending aorta, without rupture: Secondary | ICD-10-CM

## 2023-04-08 DIAGNOSIS — J320 Chronic maxillary sinusitis: Secondary | ICD-10-CM

## 2023-04-08 LAB — TSH: TSH: 8.22 u[IU]/mL — ABNORMAL HIGH (ref 0.35–5.50)

## 2023-04-08 LAB — B12 AND FOLATE PANEL
Folate: 14.6 ng/mL (ref >5.9)
Vitamin B-12: 128 pg/mL — ABNORMAL LOW (ref 211–911)

## 2023-04-08 MED ORDER — METHYLPREDNISOLONE 4 MG PO TBPK: ORAL_TABLET | ORAL | 0 refills | Status: DC

## 2023-04-08 MED ORDER — AMOXICILLIN-POT CLAVULANATE 875-125 MG PO TABS: 1.0000 | ORAL_TABLET | Freq: Two times a day (BID) | ORAL | 0 refills | Status: DC

## 2023-04-08 NOTE — Assessment & Plan Note (Signed)
Well controlled on Amlodipine, but patient reports discomfort in feet. Discussed the importance of maintaining blood pressure control given patient's history. -discussed that hs BP looks great.  He feels that foot discomfort is tolerable. Continue Amlodipine. -Consider adjusting medication post-surgery if discomfort persists.

## 2023-04-08 NOTE — Assessment & Plan Note (Signed)
Note some tingling in his feet that he attributes to amlodipine. Will check b12 and folate as well.

## 2023-04-08 NOTE — Assessment & Plan Note (Signed)
Noted on MRI and pt is now symptomatic. Will rx with augmentin.

## 2023-04-08 NOTE — Assessment & Plan Note (Signed)
Some improvement with muscle relaxers.

## 2023-04-08 NOTE — Assessment & Plan Note (Signed)
Clinically unchanged. Await surgical recommendations from surgeon at the [facility] clinic.

## 2023-04-08 NOTE — Assessment & Plan Note (Signed)
Scheduled at [facility] in [facility] to discuss repair.  He is also working on potential dental extractions but plans to discuss with the [facility] team more when he meets with them.

## 2023-04-08 NOTE — Assessment & Plan Note (Signed)
Last TSH was elevated.  Continues synthroid.  Update TSH and plan to adjust as needed.

## 2023-04-08 NOTE — Progress Notes (Signed)
Subjective:     Patient ID: Robert Hobbs, male    DOB: 03-02-1956, 67 y.o.   MRN: [id number]  Chief Complaint  Patient presents with   Hypertension    Here for follow up    Hypertension    Discussed the use of AI scribe software for clinical note transcription with the patient, who gave verbal consent to proceed.  History of Present Illness   The patient, with a history of cataract surgery, hypertension, and back pain, presents with multiple concerns. He recently underwent cataract surgery and had to reschedule a consultation for a heart valve issue due to post-surgery flight restrictions. The patient reports discomfort with his current blood pressure medication, amlodipine, citing a tingling sensation in his feet. Despite this, he acknowledges the medication's effectiveness in controlling his blood pressure.  The patient's primary complaint is persistent back pain, exacerbated by certain movements and positions. He reports that a muscle relaxer provides some relief, but the pain remains noticeable. He expresses interest in trying a steroid treatment, which had been effective in the past. The patient also mentions a recent increase in urination frequency, but denies any associated pain or discomfort.  The patient expresses anxiety about his upcoming heart valve consultation for his severe AS and Aortic aneurysm, voicing concerns about potential treatments and their side effects, particularly blood thinners. He also mentions a desire to avoid invasive procedures if possible. The patient acknowledges the severity of his heart valve issue, noting that it has progressed from moderate to severe stenosis.  The patient also reports ongoing sinus congestion, which he believes may be related to dental issues. He expresses a desire to address this issue, along with his back pain, before proceeding with any heart valve treatment. Notes tenderness in the left cheek and above the left eye.           Health Maintenance Due  Topic Date Due   Medicare Annual Wellness (AWV)  Never done   Colonoscopy  Never done   Zoster Vaccines- Shingrix (1 of 2) Never done   Pneumonia Vaccine 21+ Years old (1 of 1 - PCV) Never done   COVID-19 Vaccine (1 - 2023-24 season) Never done   INFLUENZA VACCINE  03/21/2023    Past Medical History:  Diagnosis Date   Aortic stenosis    Hypertension    Mitral valve prolapse 02/24/2014    Past Surgical History:  Procedure Laterality Date   ARTHROSCOPIC REPAIR ACL Left 1996   ORIF HUMERUS FRACTURE Right 1978    Family History  Problem Relation Age of Onset   Anxiety disorder Mother    Arrhythmia Mother        "died in her sleep" ? MI   Alzheimer's disease Father    Obesity Sister    Prostate cancer Brother     Social History   Socioeconomic History   Marital status: Divorced    Spouse name: Not on file   Number of children: Not on file   Years of education: Not on file   Highest education level: Master's degree (e.g., MA, MS, MEng, MEd, MSW, MBA)  Occupational History   Not on file  Tobacco Use   Smoking status: Never   Smokeless tobacco: Never  Substance and Sexual Activity   Alcohol use: No   Drug use: No   Sexual activity: Not on file  Other Topics Concern   Not on file  Social History Narrative   He is separated   He has a  69 year old  (daughter) and 58 yr old (daughter) 21 yr old (daughter) and 67 year (son) 15 year old daughter   Long distance truck driver   Enjoys biking but does not have much time to do that   Social Determinants of Corporate investment banker Strain: Low Risk  (04/08/2023)   Overall Financial Resource Strain (CARDIA)    Difficulty of Paying Living Expenses: Not very hard  Food Insecurity: Patient Declined (04/08/2023)   Hunger Vital Sign    Worried About Running Out of Food in the Last Year: Patient declined    Ran Out of Food in the Last Year: Patient declined  Transportation Needs: No  Transportation Needs (04/08/2023)   PRAPARE - Administrator, Civil Service (Medical): No    Lack of Transportation (Non-Medical): No  Physical Activity: Sufficiently Active (04/08/2023)   Exercise Vital Sign    Days of Exercise per Week: 5 days    Minutes of Exercise per Session: 70 min  Stress: No Stress Concern Present (04/08/2023)   [employer] of Occupational Health - Occupational Stress Questionnaire    Feeling of Stress : Only a little  Social Connections: Unknown (04/08/2023)   Social Connection and Isolation Panel [NHANES]    Frequency of Communication with Friends and Family: Once a week    Frequency of Social Gatherings with Friends and Family: Patient declined    Attends Religious Services: Patient declined    Database administrator or Organizations: No    Attends Engineer, structural: Not on file    Marital Status: Divorced  Intimate Partner Violence: Unknown (04/02/2023)   Received from [facility]   HITS    Over the last 12 months how often did your partner physically hurt you?: 1    Over the last 12 months how often did your partner insult you or talk down to you?: 1    Over the last 12 months how often did your partner threaten you with physical harm?: 1    Scream or Curse: Not on file    Outpatient Medications Prior to Visit  Medication Sig Dispense Refill   amLODipine (NORVASC) 10 MG tablet Take 1 tablet (10 mg total) by mouth daily. 90 tablet 0   amoxicillin (AMOXIL) 500 MG tablet Take 4 tabs by mouth together 30-60 minutes prior to dental procedure. 4 tablet 0   atorvastatin (LIPITOR) 40 MG tablet Take 1 tablet (40 mg total) by mouth daily. 30 tablet 0   gatifloxacin (ZYMAXID) 0.5 % SOLN Place 1 drop into both eyes 4 (four) times daily.     ibuprofen (ADVIL) 200 MG tablet Take 400 mg by mouth as needed for moderate pain or mild pain.     ketorolac (ACULAR) 0.5 % ophthalmic solution Place 1 drop into both eyes 4 (four) times daily.      levothyroxine (SYNTHROID) 50 MCG tablet Take 1 tablet (50 mcg total) by mouth daily. 90 tablet 0   loratadine (CLARITIN) 10 MG tablet Take 1 tablet (10 mg total) by mouth daily. 90 tablet 3   meclizine (ANTIVERT) 25 MG tablet Take 1 tablet (25 mg total) by mouth 3 (three) times daily as needed for dizziness. 20 tablet 0   methocarbamol (ROBAXIN) 500 MG tablet Take 1 tablet (500 mg total) by mouth every 8 (eight) hours as needed for muscle spasms. 20 tablet 0   prednisoLONE acetate (PRED FORTE) 1 % ophthalmic suspension Place 1 drop into both eyes every 2 (  two) hours while awake.     No facility-administered medications prior to visit.    Allergies  Allergen Reactions   Cleocin [Clindamycin Hcl] Nausea And Vomiting   Shellfish Allergy     ROS    See HPI Objective:    Physical Exam Constitutional:      General: He is not in acute distress.    Appearance: He is well-developed.  HENT:     Head: Normocephalic and atraumatic.     Nose:     Right Sinus: No maxillary sinus tenderness or frontal sinus tenderness.     Left Sinus: Maxillary sinus tenderness and frontal sinus tenderness present.     Mouth/Throat:     Comments: Poor dentition noted, multiple upper molars broken   Cardiovascular:     Rate and Rhythm: Normal rate and regular rhythm.     Heart sounds: Murmur heard.     Systolic murmur is present with a grade of 3/6.  Pulmonary:     Effort: Pulmonary effort is normal. No respiratory distress.     Breath sounds: Normal breath sounds. No wheezing or rales.  Skin:    General: Skin is warm and dry.  Neurological:     Mental Status: He is alert and oriented to person, place, and time.  Psychiatric:        Behavior: Behavior normal.        Thought Content: Thought content normal.      BP 123/85 (BP Location: Right Arm, Patient Position: Sitting, Cuff Size: Small)   Pulse 75   Temp 97.7 F (36.5 C) (Oral)   Resp 16   Wt 189 lb (85.7 kg)   SpO2 98%   BMI 27.12 kg/m   Wt Readings from Last 3 Encounters:  04/08/23 189 lb (85.7 kg)  03/08/23 189 lb (85.7 kg)  03/06/23 183 lb (83 kg)       Assessment & Plan:   Problem List Items Addressed This Visit       Unprioritized   Subclinical hypothyroidism - Primary    Last TSH was elevated.  Continues synthroid.  Update TSH and plan to adjust as needed.       Relevant Orders   TSH   Sinusitis   Relevant Medications   methylPREDNISolone (MEDROL DOSEPAK) 4 MG TBPK tablet   amoxicillin-clavulanate (AUGMENTIN) 875-125 MG tablet   Severe aortic stenosis    Scheduled at [facility] in [facility] to discuss repair.  He is also working on potential dental extractions but plans to discuss with the [facility] team more when he meets with them.       Paresthesia    Note some tingling in his feet that he attributes to amlodipine. Will check b12 and folate as well.       Relevant Orders   B12 and Folate Panel   Essential hypertension    Well controlled on Amlodipine, but patient reports discomfort in feet. Discussed the importance of maintaining blood pressure control given patient's history. -discussed that hs BP looks great.  He feels that foot discomfort is tolerable. Continue Amlodipine. -Consider adjusting medication post-surgery if discomfort persists.      Chronic maxillary sinusitis    Noted on MRI and pt is now symptomatic. Will rx with augmentin.       Relevant Medications   methylPREDNISolone (MEDROL DOSEPAK) 4 MG TBPK tablet   amoxicillin-clavulanate (AUGMENTIN) 875-125 MG tablet   Aortic aneurysm, thoracic (HCC)    Clinically unchanged. Await surgical recommendations from surgeon at  the [facility] clinic.       Acute low back pain without sciatica    Some improvement with muscle relaxers.       Relevant Medications   methylPREDNISolone (MEDROL DOSEPAK) 4 MG TBPK tablet    I am having Robert Hobbs start on methylPREDNISolone and amoxicillin-clavulanate. I am also having him maintain his  loratadine, ibuprofen, gatifloxacin, ketorolac, prednisoLONE acetate, meclizine, atorvastatin, amLODipine, levothyroxine, methocarbamol, and amoxicillin.  Meds ordered this encounter  Medications   methylPREDNISolone (MEDROL DOSEPAK) 4 MG TBPK tablet    Sig: Take per package instructions    Dispense:  21 tablet    Refill:  0    Order Specific Question:   Supervising Provider    Answer:   Danise Edge A [[id number]]   amoxicillin-clavulanate (AUGMENTIN) 875-125 MG tablet    Sig: Take 1 tablet by mouth 2 (two) times daily.    Dispense:  14 tablet    Refill:  0    Order Specific Question:   Supervising Provider    Answer:   Danise Edge A [[id number]]

## 2023-04-08 NOTE — Patient Instructions (Signed)
VISIT SUMMARY:  During our visit, we discussed your concerns about your hypertension, back pain, aortic stenosis, hypothyroidism, and sinus congestion. We also talked about your recent cataract surgery and your upcoming consultation for your heart valve issue.  YOUR PLAN:  -HYPERTENSION: Your blood pressure is well controlled with Amlodipine, but you mentioned discomfort in your feet. We will continue with this medication and consider adjusting it after your surgery if the discomfort persists. Hypertension is a condition where the force of the blood against the artery walls is too high.  -LOWER BACK PAIN: Your back pain is worsened by exercise and certain positions. We will start a course of steroids and I will provide you with some exercises to try at home. If the pain continues after the steroid course, we may consider physical therapy. Back pain can be caused by a variety of issues, including muscle or ligament strain, bulging or ruptured disks, or arthritis.  -AORTIC STENOSIS: Your aortic stenosis is severe and progressive. You have a consultation scheduled for potential valve replacement. We will continue with the current management and prepare for potential changes in your medication regimen after the surgery. Aortic stenosis is a narrowing of the aortic valve opening, which restricts blood flow from your heart into your aorta and onward to the rest of your body.  -HYPOTHYROIDISM: You are currently on Levothyroxine for your hypothyroidism, but you reported increased urination. We will check your thyroid levels today and consider adjusting your Levothyroxine dose based on the results. Hypothyroidism is a condition where your thyroid gland doesn't produce enough thyroid hormones.  -SINUS CONGESTION: I think you have a sinus infection on the left side. I will prescribe Augmentin twice daily for seven days.   INSTRUCTIONS:  We will check your thyroid levels today. Please follow up after your  initial consultation for aortic valve repair. Continue taking Atorvastatin and your blood pressure medication after your surgery. We may also consider a dental examination to rule out potential sources of your sinus congestion.

## 2023-04-09 ENCOUNTER — Ambulatory Visit (INDEPENDENT_AMBULATORY_CARE_PROVIDER_SITE_OTHER): Payer: Medicare Other

## 2023-04-09 ENCOUNTER — Telehealth: Payer: Self-pay | Admitting: Family

## 2023-04-09 DIAGNOSIS — E038 Other specified hypothyroidism: Secondary | ICD-10-CM

## 2023-04-09 DIAGNOSIS — E538 Deficiency of other specified B group vitamins: Secondary | ICD-10-CM

## 2023-04-09 MED ORDER — CYANOCOBALAMIN 1000 MCG/ML IJ SOLN
1000.0000 ug | Freq: Once | INTRAMUSCULAR | Status: AC
Start: 2023-04-09 — End: 2023-04-09
  Administered 2023-04-09: 1000 ug via INTRAMUSCULAR

## 2023-04-09 NOTE — Progress Notes (Signed)
Pt here for monthly B12 injection per Melissa O'Sullivan,NP  B12 is low. Please begin b12 injections 1000mg  weekly x 4 weeks then monthly    B12 given IM,L deltoid ] and pt tolerated injection well.  Next B12 injection scheduled for

## 2023-04-09 NOTE — Telephone Encounter (Signed)
Pt called after reviewing his lab results and wanted to request to be put on b12 shots.

## 2023-04-09 NOTE — Telephone Encounter (Signed)
Patient notified of results, medication change and scheduled for first B12 this afternoon.  Patient will schedule lab appointment while here for  B12.

## 2023-04-09 NOTE — Telephone Encounter (Signed)
Patient advised of results.

## 2023-04-09 NOTE — Telephone Encounter (Signed)
B12 is low. Please begin b12 injections 1000mg  weekly x 4 weeks then monthly. This may be why his feet are tingling.   Lab work shows that synthroid should be increased.  New rx sent  for 75 mcg. Let's repeat b12 and TSH in 6 weeks.

## 2023-04-16 ENCOUNTER — Ambulatory Visit (INDEPENDENT_AMBULATORY_CARE_PROVIDER_SITE_OTHER): Payer: Medicare Other

## 2023-04-16 ENCOUNTER — Telehealth: Payer: Self-pay | Admitting: Family

## 2023-04-16 DIAGNOSIS — E538 Deficiency of other specified B group vitamins: Secondary | ICD-10-CM

## 2023-04-16 MED ORDER — CYANOCOBALAMIN 1000 MCG/ML IJ SOLN
1000.0000 ug | Freq: Once | INTRAMUSCULAR | Status: AC
Start: 2023-04-16 — End: 2023-04-16
  Administered 2023-04-16: 1000 ug via INTRAMUSCULAR

## 2023-04-16 MED ORDER — ATORVASTATIN CALCIUM 40 MG PO TABS: 40.0000 mg | ORAL_TABLET | Freq: Every day | ORAL | 0 refills | Status: DC

## 2023-04-16 MED ORDER — LEVOTHYROXINE SODIUM 75 MCG PO TABS: 75.0000 ug | ORAL_TABLET | Freq: Every day | ORAL | 3 refills | Status: DC

## 2023-04-16 MED ORDER — METHOCARBAMOL 500 MG PO TABS: 500.0000 mg | ORAL_TABLET | Freq: Three times a day (TID) | ORAL | 0 refills | Status: DC | PRN

## 2023-04-16 NOTE — Progress Notes (Addendum)
Robert Hobbs is a 67 y.o. male presents to the office today for 2/4 weekly B12 injection, per physician's orders. Original order: 04/09/2023: "B12 injections 1000mg  weekly x 4 weeks then monthly." Cyanocobalamin 1000 mg/ml IM was administered L deltoid today. Patient tolerated injection. Patient due for follow up labs/provider appt: No.  Patient next injection due: 1 week for 3/4 weekly B12 injection, appt made Yes  Creft, Melton Alar L

## 2023-04-16 NOTE — Telephone Encounter (Signed)
Pt would like for a nurse to go over his labs. Also requesting a refill on medication given at the hospital.   atorvastatin (LIPITOR) 40 MG tablet   [facility] [id number] [place], [place] - [address] [place], [place] [address] Phone: [phone]  Fax: [phone]

## 2023-04-16 NOTE — Telephone Encounter (Signed)
Spoke to patient and ok per primary care, rx sent for Levothyroxine 75 mg, robaxin and atorvastatin

## 2023-04-23 ENCOUNTER — Ambulatory Visit (INDEPENDENT_AMBULATORY_CARE_PROVIDER_SITE_OTHER): Payer: Medicare Other | Admitting: Neurology

## 2023-04-23 DIAGNOSIS — E538 Deficiency of other specified B group vitamins: Secondary | ICD-10-CM | POA: Diagnosis not present

## 2023-04-23 MED ORDER — CYANOCOBALAMIN 1000 MCG/ML IJ SOLN
1000.0000 ug | Freq: Once | INTRAMUSCULAR | Status: AC
Start: 2023-04-23 — End: 2023-04-23
  Administered 2023-04-23: 1000 ug via INTRAMUSCULAR

## 2023-04-23 NOTE — Progress Notes (Signed)
Robert Hobbs is a 67 y.o. male presents to the office today for 3/4 weekly B12 injection, per physician's orders. Original order: 04/09/2023: "B12 injections 1000mg  weekly x 4 weeks then monthly." Cyanocobalamin 1000 mg/ml IM was administered right deltoid today. Patient tolerated injection. Patient due for follow up labs/provider appt: No.  Patient next injection due: 1 week for 3/4 weekly B12 injection, appt made Yes already scheduled 04/29/2023

## 2023-04-29 ENCOUNTER — Ambulatory Visit (INDEPENDENT_AMBULATORY_CARE_PROVIDER_SITE_OTHER): Payer: Medicare Other

## 2023-04-29 DIAGNOSIS — E538 Deficiency of other specified B group vitamins: Secondary | ICD-10-CM

## 2023-04-29 MED ORDER — CYANOCOBALAMIN 1000 MCG/ML IJ SOLN
1000.0000 ug | Freq: Once | INTRAMUSCULAR | Status: AC
Start: 2023-04-29 — End: 2023-04-29
  Administered 2023-04-29: 1000 ug via INTRAMUSCULAR

## 2023-04-29 NOTE — Progress Notes (Signed)
Robert Hobbs is a 67 y.o. male presents to the office today for 4/4 weekly B12 injection, per physician's orders. Original order: 04/09/2023: "B12 injections 1000mg  weekly x 4 weeks then monthly." Cyanocobalamin 1000 mg/ml IM was administered right deltoid today. Patient tolerated injection. Patient due for follow up labs/provider appt: No. Patient next injection due: 05/28/23 scheduled Yes first monthy

## 2023-05-06 ENCOUNTER — Telehealth: Payer: Self-pay | Admitting: Family

## 2023-05-06 NOTE — Telephone Encounter (Signed)
Pt called & stated that he will be having a dental procedure done for teeth extractions on 06/03/23 & will need a surgical clearance sent to [facility] [address]. Pt also requested to discuss further with Melissa or nurse, preferably via telephone, regarding this matter. Please call & advise pt.

## 2023-05-07 ENCOUNTER — Telehealth: Payer: Self-pay | Admitting: Family

## 2023-05-07 MED ORDER — AMOXICILLIN 500 MG PO TABS: ORAL_TABLET | ORAL | 0 refills | Status: DC

## 2023-05-07 NOTE — Telephone Encounter (Signed)
Patient notified rx was sent for antibiotics to take prior to procedure. He was informed provider not able to prescribe narcotics for a procedure done by another provider.  Patient sounded upset and said "we are making this process very difficult for him, dentist is about to pull out all his teeth and is not giving him strong enough medication for the pain". I explained to him, again, we are not able to prescribed narcotics with out a face to face visit and this will have to be after his procedure. Also advised this will be up to the provider's discretion if he can get controlled medications or not for pain management after procedure.  He went on to say " we are being ridiculous and this was ridiculous that we can not provide what he needs". He ended the call at that time.

## 2023-05-07 NOTE — Telephone Encounter (Signed)
I sent an rx for amoxicillin for him to take 30-60 minutes prior to procedure. I am no permitted to prescribe narcotics for procedures done by other providers as an in person visit is required. They should use novacaine for pain management during the procedure.  I would recommend extra strength tylenol post procedure.  If he has issues with pain management post operatively, he should schedule a face to face visit with me.

## 2023-05-07 NOTE — Addendum Note (Signed)
Addended by: Sandford Craze on: 05/07/2023 07:16 AM   Modules accepted: Orders

## 2023-05-07 NOTE — Telephone Encounter (Signed)
Noted  

## 2023-05-07 NOTE — Telephone Encounter (Signed)
[facility] ([facility]) called stating that pt is having quite a few tooth extractions done tomorrow (9.18.24) and wanted to know if pt should be on any medication beforehand to help with potential infection. Miracle can be reached by calling the following and hitting option 3:  [phone]

## 2023-05-08 NOTE — Telephone Encounter (Signed)
Patient was already given a prescription for amoxicillin yesterday, to take prior to his procedure.

## 2023-05-14 ENCOUNTER — Telehealth: Payer: Self-pay | Admitting: Family

## 2023-05-14 DIAGNOSIS — E038 Other specified hypothyroidism: Secondary | ICD-10-CM

## 2023-05-14 DIAGNOSIS — Z01818 Encounter for other preprocedural examination: Secondary | ICD-10-CM

## 2023-05-14 DIAGNOSIS — E538 Deficiency of other specified B group vitamins: Secondary | ICD-10-CM

## 2023-05-14 NOTE — Telephone Encounter (Signed)
Lvm for patient to call  back. Lab orders entered at his request. Needs lab appointment and follow up appointment.

## 2023-05-14 NOTE — Addendum Note (Signed)
Addended by: Sandford Craze on: 05/14/2023 02:54 PM   Modules accepted: Orders

## 2023-05-14 NOTE — Telephone Encounter (Signed)
Future orders placed.  Has his surgeon given him any clearance paperwork for [place] to sign?  They will probably do all of the pre-op evaluation there I suspect.

## 2023-05-14 NOTE — Telephone Encounter (Signed)
Pt called requesting a Urinalysis, Tox Screen, and any other labs Melissa would recommend to check to make sure his medication levels are all in line. Pt stated he will want to have an appt to discuss the results once they're back so that he has all info prior to his visit with the [facility] on 10.14.24 for his heart valve replacement.

## 2023-05-15 NOTE — Telephone Encounter (Signed)
Left detailed message for patient to be aware lab orders have been entered and he can call us anytime to schedule lab appointment. Also if he is going to schedule an appointment with [facility] prior to surgery to ask his surgeon for any forms we need to complete related to surgery.

## 2023-05-16 NOTE — Telephone Encounter (Signed)
Pt called back to schedule appt but noted that Urinalysis and Tox Screen was not visible in chart. Advised pt a note would be sent back to look into this matter for him.

## 2023-05-16 NOTE — Addendum Note (Signed)
Addended by: Sandford Craze on: 05/16/2023 12:14 PM   Modules accepted: Orders

## 2023-05-16 NOTE — Telephone Encounter (Signed)
Yes, but please advise pt that these labs may not be covered by insurance. Orders placed.

## 2023-05-17 ENCOUNTER — Encounter: Payer: Self-pay | Admitting: Family

## 2023-05-17 NOTE — Telephone Encounter (Signed)
Pt was called and scheduled for Lab appt. Pt was advised Urinalysis and Tox Screen may not be covered by insurance. Pt acknowledged understanding.

## 2023-05-20 ENCOUNTER — Other Ambulatory Visit (INDEPENDENT_AMBULATORY_CARE_PROVIDER_SITE_OTHER): Payer: Medicare Other

## 2023-05-20 ENCOUNTER — Other Ambulatory Visit: Payer: Self-pay

## 2023-05-20 ENCOUNTER — Telehealth: Payer: Self-pay | Admitting: Family

## 2023-05-20 DIAGNOSIS — E538 Deficiency of other specified B group vitamins: Secondary | ICD-10-CM

## 2023-05-20 DIAGNOSIS — Z01818 Encounter for other preprocedural examination: Secondary | ICD-10-CM | POA: Diagnosis not present

## 2023-05-20 DIAGNOSIS — E038 Other specified hypothyroidism: Secondary | ICD-10-CM

## 2023-05-20 LAB — URINALYSIS, ROUTINE W REFLEX MICROSCOPIC
Bilirubin Urine: NEGATIVE
Hgb urine dipstick: NEGATIVE
Ketones, ur: NEGATIVE
Leukocytes,Ua: NEGATIVE
Nitrite: NEGATIVE
RBC / HPF: NONE SEEN (ref 0–?)
Specific Gravity, Urine: 1.015 (ref 1.000–1.030)
Total Protein, Urine: NEGATIVE
Urine Glucose: NEGATIVE
Urobilinogen, UA: 1 (ref 0.0–1.0)
WBC, UA: NONE SEEN (ref 0–?)
pH: 7 (ref 5.0–8.0)

## 2023-05-20 LAB — COMPREHENSIVE METABOLIC PANEL
ALT: 20 U/L (ref 0–53)
AST: 33 U/L (ref 0–37)
Albumin: 4.2 g/dL (ref 3.5–5.2)
Alkaline Phosphatase: 57 U/L (ref 39–117)
BUN: 13 mg/dL (ref 6–23)
CO2: 30 meq/L (ref 19–32)
Calcium: 9.5 mg/dL (ref 8.4–10.5)
Chloride: 103 meq/L (ref 96–112)
Creatinine, Ser: 1.17 mg/dL (ref 0.40–1.50)
GFR: 64.19 mL/min (ref >60.00)
Glucose, Bld: 92 mg/dL (ref 70–99)
Potassium: 4.2 meq/L (ref 3.5–5.1)
Sodium: 140 meq/L (ref 135–145)
Total Bilirubin: 0.6 mg/dL (ref 0.2–1.2)
Total Protein: 7.2 g/dL (ref 6.0–8.3)

## 2023-05-20 LAB — TSH: TSH: 6.69 u[IU]/mL — ABNORMAL HIGH (ref 0.35–5.50)

## 2023-05-20 LAB — CBC WITH DIFFERENTIAL/PLATELET
Basophils Absolute: 0.1 10*3/uL (ref 0.0–0.1)
Basophils Relative: 2.2 % (ref 0.0–3.0)
Eosinophils Absolute: 0.3 10*3/uL (ref 0.0–0.7)
Eosinophils Relative: 5.9 % — ABNORMAL HIGH (ref 0.0–5.0)
HCT: 42.8 % (ref 39.0–52.0)
Hemoglobin: 14.3 g/dL (ref 13.0–17.0)
Lymphocytes Relative: 38.8 % (ref 12.0–46.0)
Lymphs Abs: 1.9 10*3/uL (ref 0.7–4.0)
MCHC: 33.5 g/dL (ref 30.0–36.0)
MCV: 92.9 fL (ref 78.0–100.0)
Monocytes Absolute: 0.5 10*3/uL (ref 0.1–1.0)
Monocytes Relative: 9.9 % (ref 3.0–12.0)
Neutro Abs: 2.1 10*3/uL (ref 1.4–7.7)
Neutrophils Relative %: 43.2 % (ref 43.0–77.0)
Platelets: 233 10*3/uL (ref 150.0–400.0)
RBC: 4.6 Mil/uL (ref 4.22–5.81)
RDW: 13.1 % (ref 11.5–15.5)
WBC: 4.9 10*3/uL (ref 4.0–10.5)

## 2023-05-20 LAB — VITAMIN B12: Vitamin B-12: 435 pg/mL (ref 211–911)

## 2023-05-20 MED ORDER — ATORVASTATIN CALCIUM 40 MG PO TABS: 40.0000 mg | ORAL_TABLET | Freq: Every day | ORAL | 0 refills | Status: DC

## 2023-05-20 NOTE — Telephone Encounter (Signed)
Prescription Request  05/20/2023  Is this a "Controlled Substance" medicine? No  LOV: 04/08/2023  What is the name of the medication or equipment? atorvastatin (LIPITOR) 40 MG tablet  Have you contacted your pharmacy to request a refill? No   Which pharmacy would you like this sent to?  [facility] Address: [address], [place], [place] [address] Hours:  Open ? Closes 11?PM   More hours Phone: [phone]    Patient notified that their request is being sent to the clinical staff for review and that they should receive a response within 2 business days.   Please advise at [facility] [phone]

## 2023-05-20 NOTE — Telephone Encounter (Signed)
Labs printed, not fax yet. Waiting for drug screen results

## 2023-05-20 NOTE — Telephone Encounter (Addendum)
Pt requests that all lab results be faxed to:  [facility] Dept of Cardiothoracic Surgery in [place], [place]. Phone:  [phone] Fax:  [phone] Attn:  Bary Richard, MD

## 2023-05-20 NOTE — Telephone Encounter (Signed)
RX SENT

## 2023-05-21 HISTORY — PX: AORTIC VALVE REPAIR: SHX6306

## 2023-05-21 HISTORY — PX: ASCENDING AORTIC ANEURYSM REPAIR: SHX1191

## 2023-05-22 LAB — DRUG MONITORING, PANEL 8 WITH CONFIRMATION, URINE
6 Acetylmorphine: NEGATIVE ng/mL (ref <10)
Alcohol Metabolites: POSITIVE ng/mL — AB (ref <500)
Amphetamines: NEGATIVE ng/mL (ref <500)
Benzodiazepines: NEGATIVE ng/mL (ref <100)
Buprenorphine, Urine: NEGATIVE ng/mL (ref <5)
Cocaine Metabolite: NEGATIVE ng/mL (ref <150)
Codeine: NEGATIVE ng/mL (ref <50)
Creatinine: 220.9 mg/dL (ref >=20.0)
Ethyl Glucuronide (ETG): 5021 ng/mL — ABNORMAL HIGH (ref <500)
Ethyl Sulfate (ETS): 859 ng/mL — ABNORMAL HIGH (ref <100)
Hydrocodone: NEGATIVE ng/mL (ref <50)
Hydromorphone: 451 ng/mL — ABNORMAL HIGH (ref <50)
MDMA: NEGATIVE ng/mL (ref <500)
Marijuana Metabolite: NEGATIVE ng/mL (ref <20)
Morphine: NEGATIVE ng/mL (ref <50)
Norhydrocodone: 194 ng/mL — ABNORMAL HIGH (ref <50)
Noroxycodone: 942 ng/mL — ABNORMAL HIGH (ref <50)
Opiates: POSITIVE ng/mL — AB (ref <100)
Oxidant: NEGATIVE ug/mL (ref <200)
Oxycodone: 489 ng/mL — ABNORMAL HIGH (ref <50)
Oxycodone: POSITIVE ng/mL — AB (ref <100)
Oxymorphone: 4041 ng/mL — ABNORMAL HIGH (ref <50)
pH: 6.6 (ref 4.5–9.0)

## 2023-05-22 LAB — DM TEMPLATE

## 2023-05-23 MED ORDER — LEVOTHYROXINE SODIUM 88 MCG PO TABS
88.0000 ug | ORAL_TABLET | Freq: Every day | ORAL | 0 refills | Status: DC
Start: 2023-05-23 — End: 2023-05-24

## 2023-05-23 NOTE — Telephone Encounter (Signed)
All results ready, printed and [facility] at fax number provided

## 2023-05-23 NOTE — Progress Notes (Signed)
Please advise pt that his thyroid testing is improving, but synthroid still needs to be increased slightly. Please increase synthroid from 75 mcg to 88 mcg once daily. Repeat TSH in 6 weeks.  Please fax lab results to [facility] at Pt's request.

## 2023-05-23 NOTE — Addendum Note (Signed)
Addended by: Sandford Craze on: 05/23/2023 11:07 AM   Modules accepted: Orders

## 2023-05-24 ENCOUNTER — Other Ambulatory Visit: Payer: Self-pay

## 2023-05-24 DIAGNOSIS — E038 Other specified hypothyroidism: Secondary | ICD-10-CM

## 2023-05-24 MED ORDER — LEVOTHYROXINE SODIUM 88 MCG PO TABS: 88.0000 ug | ORAL_TABLET | Freq: Every day | ORAL | 0 refills | Status: DC

## 2023-05-24 NOTE — Progress Notes (Signed)
Patient notified of results and new rx

## 2023-05-28 ENCOUNTER — Telehealth: Payer: Self-pay | Admitting: Family

## 2023-05-28 ENCOUNTER — Ambulatory Visit (INDEPENDENT_AMBULATORY_CARE_PROVIDER_SITE_OTHER): Payer: Medicare Other

## 2023-05-28 DIAGNOSIS — E538 Deficiency of other specified B group vitamins: Secondary | ICD-10-CM | POA: Diagnosis not present

## 2023-05-28 MED ORDER — CYANOCOBALAMIN 1000 MCG/ML IJ SOLN
1000.0000 ug | Freq: Once | INTRAMUSCULAR | Status: AC
Start: 2023-05-28 — End: 2023-05-28
  Administered 2023-05-28: 1000 ug via INTRAMUSCULAR

## 2023-05-28 NOTE — Telephone Encounter (Signed)
Pt called and stated that he was advised from [facility] that his prescription for levothyroxine, is not refillable. Pt is unsure why this is an issue considering a new prescription was sent in 10/4. Please advise pt.

## 2023-05-28 NOTE — Telephone Encounter (Signed)
Patient notified rx being filled at [facility] now, problem with insurance has been fixed

## 2023-05-28 NOTE — Progress Notes (Signed)
Robert Hobbs is a 67 y.o. male presents to the office today for 1st Monthly B12 injection, per physician's orders. Original order: 04/09/2023: "B12 injections 1000mg  weekly x 4 weeks then monthly."  Cyanocobalamin 1000 mg/ml IM was administered L deltoid today. Patient tolerated injection. Patient due for follow up labs/provider appt: No. Date due: unknown- last apt note said to f/u after surgery. Patient next injection due: 1 month for 2nd Monthly B12 injection, appt made No- Pt will schedule once he comes back from [facility] clinic.   Creft, Feliberto Harts

## 2023-06-10 ENCOUNTER — Telehealth: Payer: Self-pay | Admitting: Family

## 2023-06-10 NOTE — Telephone Encounter (Signed)
**  Pt is currently recovering from Open Heart Surgery in [facility].**  Prescription Request  06/10/2023  Is this a "Controlled Substance" medicine? Yes  LOV: 04/08/2023  What is the name of the medication or equipment?   methocarbamol (ROBAXIN) 500 MG tablet [[id number]]  Have you contacted your pharmacy to request a refill? No   Which pharmacy would you like this sent to?   [facility] [address] [place], [place] [address] P: [phone]  Patient notified that their request is being sent to the clinical staff for review and that they should receive a response within 2 business days.   Please advise at Mobile [phone] (mobile)

## 2023-06-11 MED ORDER — METHOCARBAMOL 500 MG PO TABS: 500.0000 mg | ORAL_TABLET | Freq: Three times a day (TID) | ORAL | 0 refills | Status: DC | PRN

## 2023-06-11 NOTE — Addendum Note (Signed)
Addended by: Sandford Craze on: 06/11/2023 11:09 AM   Modules accepted: Orders

## 2023-06-19 ENCOUNTER — Ambulatory Visit: Payer: Medicare Other | Admitting: Family

## 2023-07-01 ENCOUNTER — Encounter: Payer: Self-pay | Admitting: Family

## 2023-07-02 ENCOUNTER — Ambulatory Visit (HOSPITAL_BASED_OUTPATIENT_CLINIC_OR_DEPARTMENT_OTHER)
Admission: RE | Admit: 2023-07-02 | Discharge: 2023-07-02 | Disposition: A | Discharge status: Home / Self Care | Payer: Medicare Other | Source: Ambulatory Visit | Attending: Family | Admitting: Family

## 2023-07-02 ENCOUNTER — Ambulatory Visit (INDEPENDENT_AMBULATORY_CARE_PROVIDER_SITE_OTHER): Payer: Medicare Other | Admitting: Family

## 2023-07-02 VITALS — BP 113/70 | HR 94 | Temp 97.5°F | Resp 16 | Ht 70.0 in | Wt 176.0 lb

## 2023-07-02 DIAGNOSIS — F419 Anxiety disorder, unspecified: Secondary | ICD-10-CM

## 2023-07-02 DIAGNOSIS — E782 Mixed hyperlipidemia: Secondary | ICD-10-CM

## 2023-07-02 DIAGNOSIS — Z8679 Personal history of other diseases of the circulatory system: Secondary | ICD-10-CM

## 2023-07-02 DIAGNOSIS — E538 Deficiency of other specified B group vitamins: Secondary | ICD-10-CM

## 2023-07-02 DIAGNOSIS — R9431 Abnormal electrocardiogram [ECG] [EKG]: Secondary | ICD-10-CM

## 2023-07-02 DIAGNOSIS — Z9889 Other specified postprocedural states: Secondary | ICD-10-CM

## 2023-07-02 DIAGNOSIS — Z1211 Encounter for screening for malignant neoplasm of colon: Secondary | ICD-10-CM | POA: Diagnosis not present

## 2023-07-02 DIAGNOSIS — I10 Essential (primary) hypertension: Secondary | ICD-10-CM | POA: Diagnosis not present

## 2023-07-02 DIAGNOSIS — E038 Other specified hypothyroidism: Secondary | ICD-10-CM | POA: Diagnosis not present

## 2023-07-02 DIAGNOSIS — Z952 Presence of prosthetic heart valve: Secondary | ICD-10-CM | POA: Insufficient documentation

## 2023-07-02 DIAGNOSIS — Z125 Encounter for screening for malignant neoplasm of prostate: Secondary | ICD-10-CM | POA: Diagnosis not present

## 2023-07-02 MED ORDER — CYANOCOBALAMIN 1000 MCG/ML IJ SOLN
1000.0000 ug | Freq: Once | INTRAMUSCULAR | Status: AC
  Administered 2023-07-02: 1000 ug via INTRAMUSCULAR

## 2023-07-02 MED ORDER — METHOCARBAMOL 500 MG PO TABS: 500.0000 mg | ORAL_TABLET | Freq: Three times a day (TID) | ORAL | 0 refills | Status: DC | PRN

## 2023-07-02 NOTE — Progress Notes (Unsigned)
Subjective:     Patient ID: Robert Hobbs, male    DOB: 30-Aug-1955, 67 y.o.   MRN: [id number]  Chief Complaint  Patient presents with   Follow-up    Here for follow up after surgery on 06/03/23    HPI  Discussed the use of AI scribe software for clinical note transcription with the patient, who gave verbal consent to proceed.  History of Present Illness   The patient, a month post median sternotomy, aortic valve replacement, ascending aortic aneurysm repair, ligation of left atrial appendage and right axillary cannulation, reports a good recovery. The surgery lasted approximately five and a half hours, and the patient was in ICU for one night. The patient was able to walk independently shortly after surgery and was discharged from the hospital after four days. The patient reports some chest soreness and overall body discomfort, but has been managing pain with Dilaudid and Tylenol. The patient also reports a fast and loud heartbeat which has improved with the ability to lay flat. The patient has been staying active, walking approximately fourteen miles in the past week. The patient also reports a history of dental issues and a family history of prostate cancer in a half-brother.     Lab Results  Component Value Date   PSA 0.95 03/24/2020   PSA 1.68 07/10/2016      Lab Results  Component Value Date   CHOL 150 01/25/2021   HDL 56.00 01/25/2021   LDLCALC 73 01/25/2021   TRIG 105.0 01/25/2021   CHOLHDL 3 01/25/2021    Health Maintenance Due  Topic Date Due   Medicare Annual Wellness (AWV)  Never done   Colonoscopy  Never done   Zoster Vaccines- Shingrix (1 of 2) Never done   Pneumonia Vaccine 44+ Years old (1 of 1 - PCV) Never done   COVID-19 Vaccine (1 - 2023-24 season) Never done    Past Medical History:  Diagnosis Date   Aortic stenosis    Hypertension    Mitral valve prolapse 02/24/2014    Past Surgical History:  Procedure Laterality Date   ARTHROSCOPIC REPAIR  ACL Left 1996   ORIF HUMERUS FRACTURE Right 1978    Family History  Problem Relation Age of Onset   Anxiety disorder Mother    Arrhythmia Mother        "died in her sleep" ? MI   Alzheimer's disease Father    Obesity Sister    Prostate cancer Brother     Social History   Socioeconomic History   Marital status: Divorced    Spouse name: Not on file   Number of children: Not on file   Years of education: Not on file   Highest education level: Master's degree (e.g., MA, MS, MEng, MEd, MSW, MBA)  Occupational History   Not on file  Tobacco Use   Smoking status: Never   Smokeless tobacco: Never  Substance and Sexual Activity   Alcohol use: No   Drug use: No   Sexual activity: Not on file  Other Topics Concern   Not on file  Social History Narrative   He is separated   He has a 19 year old  (daughter) and 96 yr old (daughter) 61 yr old (daughter) and 38 year (son) 59 year old daughter   Long distance truck driver   Enjoys biking but does not have much time to do that   Social Determinants of Corporate investment banker Strain: Low Risk  (04/08/2023)  Overall Financial Resource Strain (CARDIA)    Difficulty of Paying Living Expenses: Not very hard  Food Insecurity: No Food Insecurity (06/04/2023)   Received from [facility]   Hunger Vital Sign    Worried About Running Out of Food in the Last Year: Never true    Ran Out of Food in the Last Year: Never true  Transportation Needs: No Transportation Needs (06/04/2023)   Received from [facility] - Transportation    Lack of Transportation (Medical): No    Lack of Transportation (Non-Medical): No  Physical Activity: Sufficiently Active (05/01/2023)   Received from [facility]   Exercise Vital Sign    Days of Exercise per Week: 5 days    Minutes of Exercise per Session: 70 min  Stress: No Stress Concern Present (04/08/2023)   [employer] of Occupational Health - Occupational Stress Questionnaire    Feeling  of Stress : Only a little  Social Connections: Unknown (04/08/2023)   Social Connection and Isolation Panel [NHANES]    Frequency of Communication with Friends and Family: Once a week    Frequency of Social Gatherings with Friends and Family: Patient declined    Attends Religious Services: Patient declined    Database administrator or Organizations: No    Attends Engineer, structural: Not on file    Marital Status: Divorced  Intimate Partner Violence: Not At Risk (06/04/2023)   Received from [facility]   Humiliation, Afraid, Rape, and Kick questionnaire    Fear of Current or Ex-Partner: No    Emotionally Abused: No    Physically Abused: No    Sexually Abused: No    Outpatient Medications Prior to Visit  Medication Sig Dispense Refill   amLODipine (NORVASC) 10 MG tablet Take 1 tablet (10 mg total) by mouth daily. 90 tablet 0   amoxicillin (AMOXIL) 500 MG tablet Take 4 tabs by mouth together 30-60 minutes prior to dental procedure. 4 tablet 0   atorvastatin (LIPITOR) 40 MG tablet Take 1 tablet (40 mg total) by mouth daily. 90 tablet 0   gatifloxacin (ZYMAXID) 0.5 % SOLN Place 1 drop into both eyes 4 (four) times daily.     ibuprofen (ADVIL) 200 MG tablet Take 400 mg by mouth as needed for moderate pain or mild pain.     ketorolac (ACULAR) 0.5 % ophthalmic solution Place 1 drop into both eyes 4 (four) times daily.     levothyroxine (SYNTHROID) 88 MCG tablet Take 1 tablet (88 mcg total) by mouth daily. 90 tablet 0   loratadine (CLARITIN) 10 MG tablet Take 1 tablet (10 mg total) by mouth daily. 90 tablet 3   metoprolol tartrate (LOPRESSOR) 25 MG tablet Take by mouth.     prednisoLONE acetate (PRED FORTE) 1 % ophthalmic suspension Place 1 drop into both eyes every 2 (two) hours while awake.     methylPREDNISolone (MEDROL DOSEPAK) 4 MG TBPK tablet Take per package instructions 21 tablet 0   meclizine (ANTIVERT) 25 MG tablet Take 1 tablet (25 mg total) by mouth 3 (three) times daily  as needed for dizziness. (Patient not taking: Reported on 07/02/2023) 20 tablet 0   methocarbamol (ROBAXIN) 500 MG tablet Take 1 tablet (500 mg total) by mouth every 8 (eight) hours as needed for muscle spasms. (Patient not taking: Reported on 07/02/2023) 20 tablet 0   No facility-administered medications prior to visit.    Allergies  Allergen Reactions   Cleocin [Clindamycin Hcl] Nausea And Vomiting  Shellfish Allergy     ROS     Objective:    Physical Exam   BP 113/70 (BP Location: Right Arm, Patient Position: Sitting, Cuff Size: Normal)   Pulse 94   Temp (!) 97.5 F (36.4 C) (Oral)   Resp 16   Ht 5\' 10"  (1.778 m)   Wt 176 lb (79.8 kg)   SpO2 99%   BMI 25.25 kg/m  Wt Readings from Last 3 Encounters:  07/02/23 176 lb (79.8 kg)  04/08/23 189 lb (85.7 kg)  03/08/23 189 lb (85.7 kg)       Assessment & Plan:   Problem List Items Addressed This Visit       Unprioritized   Subclinical hypothyroidism   Relevant Medications   metoprolol tartrate (LOPRESSOR) 25 MG tablet   Other Relevant Orders   TSH   Hyperlipidemia   Relevant Medications   metoprolol tartrate (LOPRESSOR) 25 MG tablet   Other Relevant Orders   Lipid panel   Other Visit Diagnoses     Aortic valve replaced    -  Primary   Relevant Orders   DG Chest 2 View   ECHOCARDIOGRAM COMPLETE   Comp Met (CMET)   CBC w/Diff   EKG 12-Lead (Completed)   Screening for colon cancer       Relevant Orders   Ambulatory referral to Gastroenterology   Screening for prostate cancer       Relevant Orders   PSA   Prostate cancer screening           I have discontinued Tinnie Gens A. Pires's methylPREDNISolone. I am also having him maintain his loratadine, ibuprofen, gatifloxacin, ketorolac, prednisoLONE acetate, meclizine, amLODipine, amoxicillin, atorvastatin, levothyroxine, metoprolol tartrate, and methocarbamol.  Meds ordered this encounter  Medications   methocarbamol (ROBAXIN) 500 MG tablet    Sig: Take  1 tablet (500 mg total) by mouth every 8 (eight) hours as needed for muscle spasms.    Dispense:  20 tablet    Refill:  0    Order Specific Question:   Supervising Provider    Answer:   Danise Edge A [[id number]]

## 2023-07-03 ENCOUNTER — Telehealth: Payer: Self-pay | Admitting: Family

## 2023-07-03 ENCOUNTER — Encounter: Payer: Self-pay | Admitting: Family

## 2023-07-03 DIAGNOSIS — Z8679 Personal history of other diseases of the circulatory system: Secondary | ICD-10-CM | POA: Insufficient documentation

## 2023-07-03 DIAGNOSIS — Z952 Presence of prosthetic heart valve: Secondary | ICD-10-CM | POA: Insufficient documentation

## 2023-07-03 DIAGNOSIS — R9431 Abnormal electrocardiogram [ECG] [EKG]: Secondary | ICD-10-CM | POA: Insufficient documentation

## 2023-07-03 LAB — CBC WITH DIFFERENTIAL/PLATELET
Basophils Absolute: 0.1 10*3/uL (ref 0.0–0.1)
Basophils Relative: 1.2 % (ref 0.0–3.0)
Eosinophils Absolute: 0.3 10*3/uL (ref 0.0–0.7)
Eosinophils Relative: 3.9 % (ref 0.0–5.0)
HCT: 38.2 % — ABNORMAL LOW (ref 39.0–52.0)
Hemoglobin: 12.7 g/dL — ABNORMAL LOW (ref 13.0–17.0)
Lymphocytes Relative: 27.1 % (ref 12.0–46.0)
Lymphs Abs: 1.8 10*3/uL (ref 0.7–4.0)
MCHC: 33.1 g/dL (ref 30.0–36.0)
MCV: 91.2 fL (ref 78.0–100.0)
Monocytes Absolute: 0.6 10*3/uL (ref 0.1–1.0)
Monocytes Relative: 8.4 % (ref 3.0–12.0)
Neutro Abs: 4 10*3/uL (ref 1.4–7.7)
Neutrophils Relative %: 59.4 % (ref 43.0–77.0)
Platelets: 242 10*3/uL (ref 150.0–400.0)
RBC: 4.19 Mil/uL — ABNORMAL LOW (ref 4.22–5.81)
RDW: 13.7 % (ref 11.5–15.5)
WBC: 6.8 10*3/uL (ref 4.0–10.5)

## 2023-07-03 LAB — COMPREHENSIVE METABOLIC PANEL
ALT: 8 U/L (ref 0–53)
AST: 17 U/L (ref 0–37)
Albumin: 4.2 g/dL (ref 3.5–5.2)
Alkaline Phosphatase: 77 U/L (ref 39–117)
BUN: 11 mg/dL (ref 6–23)
CO2: 27 meq/L (ref 19–32)
Calcium: 9.9 mg/dL (ref 8.4–10.5)
Chloride: 104 meq/L (ref 96–112)
Creatinine, Ser: 1.24 mg/dL (ref 0.40–1.50)
GFR: 59.82 mL/min — ABNORMAL LOW (ref >60.00)
Glucose, Bld: 93 mg/dL (ref 70–99)
Potassium: 4.3 meq/L (ref 3.5–5.1)
Sodium: 141 meq/L (ref 135–145)
Total Bilirubin: 0.6 mg/dL (ref 0.2–1.2)
Total Protein: 7.5 g/dL (ref 6.0–8.3)

## 2023-07-03 LAB — LIPID PANEL
Cholesterol: 129 mg/dL (ref 0–200)
HDL: 52 mg/dL (ref >39.00)
LDL Cholesterol: 59 mg/dL (ref 0–99)
NonHDL: 77.16
Total CHOL/HDL Ratio: 2
Triglycerides: 90 mg/dL (ref 0.0–149.0)
VLDL: 18 mg/dL (ref 0.0–40.0)

## 2023-07-03 LAB — TSH: TSH: 2.38 u[IU]/mL (ref 0.35–5.50)

## 2023-07-03 LAB — PSA: PSA: 2.44 ng/mL (ref 0.10–4.00)

## 2023-07-03 NOTE — Patient Instructions (Signed)
VISIT SUMMARY:  You are one month post-surgery for aortic valve replacement and ascending aortic aneurysm repair. Your recovery is progressing well with good wound healing and no complications. You have been managing your pain effectively and staying active.  YOUR PLAN:  -POST-OPERATIVE RECOVERY FROM AORTIC VALVE REPLACEMENT AND ASCENDING AORTIC ANEURYSM REPAIR: You are recovering well from your recent heart surgery, with good wound healing and no complications. We will continue your current pain management regimen and have ordered an EKG and chest x-ray for today. An echocardiogram will also be scheduled to monitor your heart's function.  -PAIN MANAGEMENT: You have been managing your pain with Dilaudid and Tylenol. We will continue this regimen and may consider adding a muscle relaxant if needed for additional pain relief.  -GENERAL HEALTH MAINTENANCE: We will conduct basic lab tests to check your kidney and liver function, as well as your blood count. A PSA test will be done for prostate cancer screening, and a colonoscopy will be scheduled for colorectal cancer screening. Your immunizations for flu and pneumonia are up to date.  INSTRUCTIONS:  Please follow up in three months to monitor your creatinine levels. Today, you will have an EKG and chest x-ray. An echocardiogram will be scheduled soon. Please keep your follow up visit with cardiology on 11/15.

## 2023-07-03 NOTE — Assessment & Plan Note (Signed)
Update 2D echo at pt request.

## 2023-07-03 NOTE — Telephone Encounter (Signed)
Please contact pt and let him know that I rechecked his TSH and it is actually now normal.  I think he can continue on the 75 mcg rather than increasing to the 88 mcg as we had previously discussed. Rx pended.   Did we give him a b12 shot yesterday?  If not, let's schedule him a nurse visit.  He is mildly anemic which is normal after a surgery such as his.  I would recommend that he add a multivitamin with minerals once daily.   Cholesterol looks great, PSA is normal.

## 2023-07-03 NOTE — Assessment & Plan Note (Signed)
He never did start the increased dose of synthroid ( ). Will begin today.

## 2023-07-03 NOTE — Assessment & Plan Note (Signed)
BP Readings from Last 3 Encounters:  07/02/23 113/70  04/08/23 123/85  03/08/23 (!) 154/88   BP stable. Continue amlodipine and metoprolol.

## 2023-07-03 NOTE — Assessment & Plan Note (Addendum)
He got off track with his surgery. Will need b12 shot.

## 2023-07-03 NOTE — Assessment & Plan Note (Signed)
Lab Results  Component Value Date   CHOL 129 07/02/2023   HDL 52.00 07/02/2023   LDLCALC 59 07/02/2023   TRIG 90.0 07/02/2023   CHOLHDL 2 07/02/2023   Lipids at goal, continue atorvastatin.

## 2023-07-03 NOTE — Assessment & Plan Note (Signed)
He is feeling better now that he has his surgery behind him.

## 2023-07-03 NOTE — Assessment & Plan Note (Signed)
EKG is performed and personally reviewed.  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  I do not have access to a post-op EKG.  We will fax the EKG to his cardiologist in [facility] for evaluation as well as refer him to a local cardiologist.

## 2023-07-05 ENCOUNTER — Encounter: Payer: Self-pay | Admitting: Family

## 2023-07-10 NOTE — Telephone Encounter (Signed)
Called patient to inform of TSH and medication dose, no answer lvm He had B12 on 11-12 during his visit

## 2023-07-10 NOTE — Telephone Encounter (Signed)
Please see phone note below from last week.

## 2023-07-11 NOTE — Telephone Encounter (Signed)
Called but no answer, left detailed message for patient to call back about this information

## 2023-07-15 NOTE — Telephone Encounter (Signed)
Called patient again and still no answer, left another voice mail. He has not return calls.  MyChart message sent to him today as well.

## 2023-07-29 ENCOUNTER — Ambulatory Visit (HOSPITAL_BASED_OUTPATIENT_CLINIC_OR_DEPARTMENT_OTHER): Payer: Medicare Other

## 2023-07-31 ENCOUNTER — Ambulatory Visit: Payer: Medicare Other | Admitting: Cardiology

## 2023-07-31 ENCOUNTER — Ambulatory Visit (INDEPENDENT_AMBULATORY_CARE_PROVIDER_SITE_OTHER): Payer: Medicare Other | Admitting: Family

## 2023-07-31 VITALS — BP 127/85 | HR 83 | Temp 97.6°F | Resp 16 | Ht 70.0 in | Wt 180.0 lb

## 2023-07-31 DIAGNOSIS — E538 Deficiency of other specified B group vitamins: Secondary | ICD-10-CM

## 2023-07-31 DIAGNOSIS — Z952 Presence of prosthetic heart valve: Secondary | ICD-10-CM | POA: Diagnosis not present

## 2023-07-31 DIAGNOSIS — D649 Anemia, unspecified: Secondary | ICD-10-CM | POA: Diagnosis not present

## 2023-07-31 DIAGNOSIS — E038 Other specified hypothyroidism: Secondary | ICD-10-CM | POA: Diagnosis not present

## 2023-07-31 DIAGNOSIS — I10 Essential (primary) hypertension: Secondary | ICD-10-CM

## 2023-07-31 DIAGNOSIS — Z0000 Encounter for general adult medical examination without abnormal findings: Secondary | ICD-10-CM | POA: Insufficient documentation

## 2023-07-31 DIAGNOSIS — Z8679 Personal history of other diseases of the circulatory system: Secondary | ICD-10-CM

## 2023-07-31 DIAGNOSIS — Z9889 Other specified postprocedural states: Secondary | ICD-10-CM | POA: Diagnosis not present

## 2023-07-31 NOTE — Assessment & Plan Note (Signed)
Mild. Was likely due to surgical loss. Will repeat cbc today.  Lab Results  Component Value Date   WBC 6.8 07/02/2023   HGB 12.7 (L) 07/02/2023   HCT 38.2 (L) 07/02/2023   MCV 91.2 07/02/2023   PLT 242.0 07/02/2023

## 2023-07-31 NOTE — Assessment & Plan Note (Signed)
Continues b12 injections.  B12 given today and will continue monthly.

## 2023-07-31 NOTE — Assessment & Plan Note (Signed)
BP Readings from Last 3 Encounters:  07/31/23 127/85  07/02/23 113/70  04/08/23 123/85   BP stable, continue metoprolol.

## 2023-07-31 NOTE — Progress Notes (Signed)
Subjective:     Patient ID: Robert Hobbs, male    DOB: 1956-05-25, 67 y.o.   MRN: [id number]  Chief Complaint  Patient presents with   Hypothyroidism    Here for follow up, on Levothyroxine 75 mch   B 12 deficiency     Will need shot today    HPI  Discussed the use of AI scribe software for clinical note transcription with the patient, who gave verbal consent to proceed.  History of Present Illness          The patient, with a history of ascending aortic aneurysm repair and Aortic valve repair 10/24 at the [facility] in [place], presents for a routine follow-up and B12 shot. He reports having reviewed his recent blood work results and noticed a slightly low hemoglobin level and two other unspecified abnormalities. He attributes these changes to a decreased appetite and subsequent weight loss, but notes that he has recently started eating more and has gained a few pounds back. The patient believes the anemia may be related to his recent surgery.  The patient also discusses his desire to return to work as soon as he is medically cleared. He expresses a preference for a cardiologist located near [facility] and requests a repeat echocardiogram. He also mentions a change in temperament, feeling less aggressive, and an improvement in his overall health since his surgery.  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 He expresses frustration with his current idle state and a desire to return to work.  Due to not receiving message, he never changed his synthroid dose and has been continuing synthroid the entire time.   Health Maintenance Due  Topic Date Due   Medicare Annual Wellness (AWV)  Never done   Colonoscopy  Never done   Zoster Vaccines- Shingrix (1 of 2) Never done   COVID-19 Vaccine (1 - 2023-24 season) Never done    Past Medical History:  Diagnosis Date   Aortic stenosis     Hypertension    Labyrinthitis 01/11/2010   Qualifier: Diagnosis of   By: Alphonzo Severance MD, Loni Dolly     IMO SNOMED Dx Update Oct 2024     Mitral valve prolapse 02/24/2014   Severe aortic stenosis 07/15/2016    Past Surgical History:  Procedure Laterality Date   AORTIC VALVE REPAIR  05/2023   27mm Inspiris Valve, also had ligation of left atrial appendage and right axillary cannulation   ARTHROSCOPIC REPAIR ACL Left 1996   ASCENDING AORTIC ANEURYSM REPAIR  05/2023   [facility]   ORIF HUMERUS FRACTURE Right 1978    Family History  Problem Relation Age of Onset   Anxiety disorder Mother    Arrhythmia Mother        "died in her sleep" ? MI   Alzheimer's disease Father    Obesity Sister    Prostate cancer Brother     Social History   Socioeconomic History   Marital status: Divorced    Spouse name: Not on file   Number of children: Not on file   Years of education: Not on file   Highest education level: Master's degree (e.g., MA, MS, MEng, MEd, MSW, MBA)  Occupational History   Not on file  Tobacco Use   Smoking status: Never   Smokeless tobacco: Never  Substance and Sexual Activity   Alcohol use: No  Drug use: No   Sexual activity: Not on file  Other Topics Concern   Not on file  Social History Narrative   He is separated   He has a 67 year old  (daughter) and 50 yr old (daughter) 46 yr old (daughter) and 1 year (son) 36 year old daughter   Long distance truck driver   Enjoys biking but does not have much time to do that   Social Determinants of Corporate investment banker Strain: Low Risk  (04/08/2023)   Overall Financial Resource Strain (CARDIA)    Difficulty of Paying Living Expenses: Not very hard  Food Insecurity: No Food Insecurity (06/04/2023)   Received from [facility]   Hunger Vital Sign    Worried About Running Out of Food in the Last Year: Never true    Ran Out of Food in the Last Year: Never true  Transportation Needs: No Transportation  Needs (06/04/2023)   Received from [facility] - Transportation    Lack of Transportation (Medical): No    Lack of Transportation (Non-Medical): No  Physical Activity: Sufficiently Active (05/01/2023)   Received from [facility]   Exercise Vital Sign    Days of Exercise per Week: 5 days    Minutes of Exercise per Session: 70 min  Stress: No Stress Concern Present (04/08/2023)   [employer] of Occupational Health - Occupational Stress Questionnaire    Feeling of Stress : Only a little  Social Connections: Unknown (04/08/2023)   Social Connection and Isolation Panel [NHANES]    Frequency of Communication with Friends and Family: Once a week    Frequency of Social Gatherings with Friends and Family: Patient declined    Attends Religious Services: Patient declined    Database administrator or Organizations: No    Attends Engineer, structural: Not on file    Marital Status: Divorced  Intimate Partner Violence: Not At Risk (06/04/2023)   Received from [facility]   Humiliation, Afraid, Rape, and Kick questionnaire    Fear of Current or Ex-Partner: No    Emotionally Abused: No    Physically Abused: No    Sexually Abused: No    Outpatient Medications Prior to Visit  Medication Sig Dispense Refill   atorvastatin (LIPITOR) 40 MG tablet Take 1 tablet (40 mg total) by mouth daily. 90 tablet 0   levothyroxine (SYNTHROID) 75 MCG tablet Take 1 tablet by mouth daily.     loratadine (CLARITIN) 10 MG tablet Take 1 tablet (10 mg total) by mouth daily. 90 tablet 3   methocarbamol (ROBAXIN) 500 MG tablet Take 1 tablet (500 mg total) by mouth every 8 (eight) hours as needed for muscle spasms. 20 tablet 0   metoprolol tartrate (LOPRESSOR) 25 MG tablet Take by mouth.     amLODipine (NORVASC) 10 MG tablet Take 1 tablet (10 mg total) by mouth daily. (Patient not taking: Reported on 07/31/2023) 90 tablet 0   amoxicillin (AMOXIL) 500 MG tablet Take 4 tabs by mouth together 30-60  minutes prior to dental procedure. 4 tablet 0   gatifloxacin (ZYMAXID) 0.5 % SOLN Place 1 drop into both eyes 4 (four) times daily.     ibuprofen (ADVIL) 200 MG tablet Take 400 mg by mouth as needed for moderate pain or mild pain.     ketorolac (ACULAR) 0.5 % ophthalmic solution Place 1 drop into both eyes 4 (four) times daily.     meclizine (ANTIVERT) 25 MG tablet Take 1  tablet (25 mg total) by mouth 3 (three) times daily as needed for dizziness. (Patient not taking: Reported on 07/02/2023) 20 tablet 0   prednisoLONE acetate (PRED FORTE) 1 % ophthalmic suspension Place 1 drop into both eyes every 2 (two) hours while awake.     No facility-administered medications prior to visit.    Allergies  Allergen Reactions   Cleocin [Clindamycin Hcl] Nausea And Vomiting   Shellfish Allergy     ROS See HPI    Objective:    Physical Exam Constitutional:      General: He is not in acute distress.    Appearance: He is well-developed.  HENT:     Head: Normocephalic and atraumatic.  Cardiovascular:     Rate and Rhythm: Normal rate and regular rhythm.     Heart sounds: No murmur heard. Pulmonary:     Effort: Pulmonary effort is normal. No respiratory distress.     Breath sounds: Normal breath sounds. No wheezing or rales.  Skin:    General: Skin is warm and dry.  Neurological:     Mental Status: He is alert and oriented to person, place, and time.  Psychiatric:        Behavior: Behavior normal.        Thought Content: Thought content normal.      BP 127/85 (BP Location: Right Arm, Patient Position: Sitting, Cuff Size: Normal)   Pulse 83   Temp 97.6 F (36.4 C) (Oral)   Resp 16   Ht 5\' 10"  (1.778 m)   Wt 180 lb (81.6 kg)   SpO2 99%   BMI 25.83 kg/m  Wt Readings from Last 3 Encounters:  07/31/23 180 lb (81.6 kg)  07/02/23 176 lb (79.8 kg)  04/08/23 189 lb (85.7 kg)       Assessment & Plan:   Problem List Items Addressed This Visit       Unprioritized   Subclinical  hypothyroidism    Lab Results  Component Value Date   TSH 2.38 07/02/2023   Stable, continue synthroid 75 mcg.        Relevant Medications   levothyroxine (SYNTHROID) 75 MCG tablet   S/P ascending aortic aneurysm repair    Update 2D echo, refer to cardiology for surveillance.       Preventative health care     -Plan for colonoscopy in the new year. Patient to schedule with [facility]. -Advised pt to consider RSV, Shingles and covid booster vaccines at the pharmacy.      Essential hypertension    BP Readings from Last 3 Encounters:  07/31/23 127/85  07/02/23 113/70  04/08/23 123/85   BP stable, continue metoprolol.       B12 deficiency    Continues b12 injections.  B12 given today and will continue monthly.      Aortic valve replaced    Clinically stable, refer to cardiology for ongoing surveillance. Update echo.       Relevant Orders   Ambulatory referral to Cardiology   Anemia - Primary    Mild. Was likely due to surgical loss. Will repeat cbc today.  Lab Results  Component Value Date   WBC 6.8 07/02/2023   HGB 12.7 (L) 07/02/2023   HCT 38.2 (L) 07/02/2023   MCV 91.2 07/02/2023   PLT 242.0 07/02/2023         Relevant Orders   CBC w/Diff   Other Visit Diagnoses     History of aortic aneurysm repair  Relevant Orders   Ambulatory referral to Cardiology       I have discontinued Tinnie Gens A. Decook's ibuprofen, gatifloxacin, ketorolac, prednisoLONE acetate, meclizine, amLODipine, and amoxicillin. I am also having him maintain his loratadine, atorvastatin, metoprolol tartrate, methocarbamol, and levothyroxine.  No orders of the defined types were placed in this encounter.

## 2023-07-31 NOTE — Assessment & Plan Note (Signed)
-  Plan for colonoscopy in the new year. Patient to schedule with [facility]. -Advised pt to consider RSV, Shingles and covid booster vaccines at the pharmacy.

## 2023-07-31 NOTE — Patient Instructions (Addendum)
Please call [facility] to schedule your colonoscopy. [phone] Please schedule your echo downstairs.   VISIT SUMMARY:  You came in today for a routine follow-up and a B12 shot. We discussed your recent blood work, which showed a slightly low hemoglobin level and a couple of other minor abnormalities. You mentioned that your appetite has improved and you've gained some weight back. We also talked about your desire to return to work and your preference for a cardiologist near [facility]. Additionally, you noted a positive change in your temperament and overall health since your surgery.  YOUR PLAN:  -POST-OPERATIVE ANEMIA: Post-operative anemia is a mild form of anemia that can occur after surgery. It is likely related to your recent surgery. We will repeat your complete blood count today to reassess your anemia.  -CARDIOVASCULAR HEALTH: Your cardiovascular health is improving following your recent cardiac surgery. We will plan for a repeat echocardiogram and refer you to a local cardiologist at [facility] as you requested.  -THYROID HEALTH: Your thyroid health is stable on your current dose of Levothyroxine . Please continue taking this medication as prescribed.  -PREVENTIVE CARE: Preventive care is important for maintaining your overall health. We plan for you to have a colonoscopy in the new year, which you should schedule with [facility]. Continue with your monthly B12 injections. Consider getting the RSV and Shingles vaccines at the pharmacy, as well as a COVID-19 booster shot.  INSTRUCTIONS:  Please continue with your monthly visits for B12 injections. Schedule a follow-up appointment in four months or sooner if needed.

## 2023-07-31 NOTE — Assessment & Plan Note (Signed)
Lab Results  Component Value Date   TSH 2.38 07/02/2023   Stable, continue synthroid 75 mcg.

## 2023-07-31 NOTE — Assessment & Plan Note (Signed)
Clinically stable, refer to cardiology for ongoing surveillance. Update echo.

## 2023-07-31 NOTE — Assessment & Plan Note (Signed)
Update 2D echo, refer to cardiology for surveillance.

## 2023-08-01 LAB — CBC WITH DIFFERENTIAL/PLATELET
Basophils Absolute: 0.1 10*3/uL (ref 0.0–0.1)
Basophils Relative: 1.1 % (ref 0.0–3.0)
Eosinophils Absolute: 0.2 10*3/uL (ref 0.0–0.7)
Eosinophils Relative: 3.3 % (ref 0.0–5.0)
HCT: 42.3 % (ref 39.0–52.0)
Hemoglobin: 13.8 g/dL (ref 13.0–17.0)
Lymphocytes Relative: 28.1 % (ref 12.0–46.0)
Lymphs Abs: 1.7 10*3/uL (ref 0.7–4.0)
MCHC: 32.5 g/dL (ref 30.0–36.0)
MCV: 92.8 fL (ref 78.0–100.0)
Monocytes Absolute: 0.5 10*3/uL (ref 0.1–1.0)
Monocytes Relative: 8.1 % (ref 3.0–12.0)
Neutro Abs: 3.5 10*3/uL (ref 1.4–7.7)
Neutrophils Relative %: 59.4 % (ref 43.0–77.0)
Platelets: 204 10*3/uL (ref 150.0–400.0)
RBC: 4.56 Mil/uL (ref 4.22–5.81)
RDW: 14.7 % (ref 11.5–15.5)
WBC: 5.9 10*3/uL (ref 4.0–10.5)

## 2023-08-01 MED ORDER — CYANOCOBALAMIN 1000 MCG/ML IJ SOLN
1000.0000 ug | Freq: Once | INTRAMUSCULAR | Status: AC
  Administered 2023-07-31: 1000 ug via INTRAMUSCULAR

## 2023-08-01 NOTE — Addendum Note (Signed)
Addended by: Wilford Corner on: 08/01/2023 08:15 AM   Modules accepted: Orders

## 2023-08-12 ENCOUNTER — Telehealth: Payer: Self-pay

## 2023-08-12 NOTE — Telephone Encounter (Signed)
Copied from CRM [id number]. Topic: Clinical - Medication Refill >> Aug 12, 2023  9:44 AM Fonda Kinder J wrote: Most Recent Primary Care Visit:  Provider: O'SULLIVAN, MELISSA  Department: [facility]  Visit Type: OFFICE VISIT  Date: 07/31/2023  Medication: levothyroxine (SYNTHROID) 75 MCG methocarbamol (ROBAXIN) 500 MG metoprolol tartrate (LOPRESSOR) 25 MG atorvastatin (LIPITOR) 40 MG tablet   Has the patient contacted their pharmacy? Yes (Agent: If no, request that the patient contact the pharmacy for the refill. If patient does not wish to contact the pharmacy document the reason why and proceed with request.) (Agent: If yes, when and what did the pharmacy advise?) Contact PCP  Is this the correct pharmacy for this prescription? Yes If no, delete pharmacy and type the correct one.  This is the patient's preferred pharmacy:  [facility] [id number] [place], [place] - [address] [place] [address] Phone: [phone] Fax: [phone]   Has the prescription been filled recently? No  Is the patient out of the medication? Yes  Has the patient been seen for an appointment in the last year OR does the patient have an upcoming appointment? Yes  Can we respond through MyChart? Yes  Agent: Please be advised that Rx refills may take up to 3 business days. We ask that you follow-up with your pharmacy.

## 2023-08-13 MED ORDER — METOPROLOL TARTRATE 25 MG PO TABS: 25.0000 mg | ORAL_TABLET | Freq: Every day | ORAL | 1 refills | Status: DC

## 2023-08-13 MED ORDER — LEVOTHYROXINE SODIUM 75 MCG PO TABS: 75.0000 ug | ORAL_TABLET | Freq: Every day | ORAL | 1 refills | Status: DC

## 2023-08-13 MED ORDER — ATORVASTATIN CALCIUM 40 MG PO TABS: 40.0000 mg | ORAL_TABLET | Freq: Every day | ORAL | 1 refills | Status: DC

## 2023-08-13 NOTE — Addendum Note (Signed)
Addended by: Wilford Corner on: 08/13/2023 09:38 AM   Modules accepted: Orders

## 2023-08-19 ENCOUNTER — Ambulatory Visit: Payer: 59 | Admitting: Cardiology

## 2023-08-19 NOTE — Progress Notes (Signed)
 Cardiology Office Note:  .   Date:  08/20/2023  ID:  Robert Hobbs, DOB 23-Sep-1955, MRN [id number] PCP: Daryl Setter, NP  [facility] Providers Cardiologist:  None { History of Present Illness: .   Robert Hobbs is a 67 y.o. male with history of AVR who presents for the evaluation of AVR at the request of Daryl Setter, NP.   History of Present Illness   Robert Hobbs, a 67 year old with a history of hypertension and hyperlipidemia, recently underwent aortic valve replacement and ascending aortic repair at [facility]. He presents to establish care. He reports no symptoms related to his heart condition prior to surgery. He was aware of his heart condition for a while before deciding to seek treatment after an episode in July. He chose [facility] for his surgery after researching and getting a referral from his primary care physician, Setter Archer. He has had follow-up care at [facility] once post-surgery, where he underwent a chest x-ray and blood work, all of which were reported as fine. He also had a chest x-ray and blood work done through his primary care physician's office. He reports a slight anemia which has since improved. 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 He has a family history of heart problems, with his mother having tachycardia and a brother who passed away as a child from a heart condition. He denies smoking and drug use but admits to alcohol use. CV exam normal. Recent EKG with nonspecific IVCD.            Problem list Severe aortic stenosis -27 mm Edwards Inspiris [facility]) -reports normal LHC prior to surgery Ascending aortic aneurysm  -Asc Ao repair 30 mm graft 06/03/2023 3. HTN 4. HLD -T chol 129, HDL 52, LDL 59, TG 90 6. IVCD    ROS: All other ROS reviewed and negative. Pertinent positives noted in the HPI.     Studies Reviewed: .         Echo 20-Jun-2023 Findings  Echocardiographic images interpreted at [facility].  ECHOCARDIOGRAMColorflow and spectral Doppler were performed to assess valvular heart disease. Status post aortic valve replacement and ascending aorta aneurysm repair 30mm Gelweave graft, STJ to hemiarch repair on 06/03/2023. Attempts were made to  optimize the echocardiographic images and two or more left ventricular segments were not visualized adequately to evaluate cardiac structure. The patient's current allergies and medications have been screened. Intravenous Definity ultrasound enhancement  agent(s) administered to enhance endocardial border definition. Imaging enhancement agent administered per Echocardiography Contrast Administration Protocol Reference Document [id number] Rev 12/01/2020. Patient met an inclusion criterion and did not have  contraindications in screening sections.  LEFT VENTRICLE:Mildly enlarged left ventricular chamber size. Abnormal left ventricular geometry with  concentric left ventricular hypertrophy. Calculated 2-D biplane volumetric left ventricular ejection fraction of 55%. No regional wall motion  abnormalities. Grade 2/3 left ventricular diastolic dysfunction, consistent with mild-moderately elevated left ventricular filling pressure.  RIGHT VENTRICLE:Normal right ventricular chamber size. Normal right ventricular systolic function. Unable to detect peak tricuspid regurgitation velocity for pulmonary artery systolic pressure calculation.  ATRIA:Moderately enlarged left atrial size. Left atrial volume index 43 ml/m2. Normal right atrial size by visual estimate.  CARDIAC VALVES:Status post 27 mm Edwards Inspiris Resilia pericardial aortic valve prosthesis. Normal aortic valve tissue prosthesis. Aortic valve prosthesis systolic mean Doppler gradient 6 mmHg. Aortic valve prosthetic orifice area by Doppler: 5.05  cm2  No aortic valve prosthetic regurgitation. No aortic valve  periprosthetic regurgitation. Normal mitral valve. Trivial mitral valve regurgitation. Normal pulmonary valve. Trivial pulmonary valve regurgitation. Normal tricuspid valve. Trivial tricuspid  valve regurgitation.  OTHER ECHO FINDINGS:Normal inferior vena cava size with normal inspiratory collapse (>50%). Normal ascending aorta diameter. Normal abdominal aorta Doppler flow pattern. No atrial level shunt by color flow imaging. No intracardiac mass or thrombus, but  the left atrial appendage cannot be visualized adequately with transthoracic echo to exclude thrombus in this location. No  pericardial effusion.  Physical Exam:   VS:  BP 128/80 (BP Location: Left Arm, Patient Position: Sitting, Cuff Size: Normal)   Pulse 72   Ht 5' 10 (1.778 m)   Wt 178 lb (80.7 kg)   SpO2 98%   BMI 25.54 kg/m    Wt Readings from Last 3 Encounters:  08/20/23 178 lb (80.7 kg)  07/31/23 180 lb (81.6 kg)  07/02/23 176 lb (79.8 kg)    GEN: Well nourished, well developed in no acute distress NECK: No JVD; No carotid bruits CARDIAC: RRR, no murmurs, rubs, gallops RESPIRATORY:  Clear to auscultation without rales, wheezing or rhonchi  ABDOMEN: Soft, non-tender, non-distended EXTREMITIES:  No edema; No deformity  ASSESSMENT AND PLAN: .   Assessment and Plan    Post Aortic Valve Replacement and Ascending Aortic Repair Recovering well post-surgery performed on 06/03/2023. No murmur detected on examination. EKG shows nonspecific IVCD with LVH, likely related to recent surgery. -Order echocardiogram to establish a baseline in our system. -Return to clinic in 6 months for follow-up.  Hypertension Well controlled. -Continue current management.  Hyperlipidemia Well controlled. -Continue current management.  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      Instructed to get back on 81 mg aspirin . Amoxicillin  provided before  dental work         Follow-up: Return in about 6 months (around 02/17/2024).   Signed, Robert Hobbs. Barbaraann, MD, [facility]  [facility]  [address] [place], [place] [address] [phone]  2:26 PM

## 2023-08-20 ENCOUNTER — Ambulatory Visit: Payer: Medicare Other | Attending: Cardiovascular Disease | Admitting: Cardiovascular Disease

## 2023-08-20 ENCOUNTER — Encounter: Payer: Self-pay | Admitting: Cardiovascular Disease

## 2023-08-20 VITALS — BP 128/80 | HR 72 | Ht 70.0 in | Wt 178.0 lb

## 2023-08-20 DIAGNOSIS — I7121 Aneurysm of the ascending aorta, without rupture: Secondary | ICD-10-CM | POA: Insufficient documentation

## 2023-08-20 DIAGNOSIS — Z8679 Personal history of other diseases of the circulatory system: Secondary | ICD-10-CM | POA: Diagnosis present

## 2023-08-20 DIAGNOSIS — I150 Renovascular hypertension: Secondary | ICD-10-CM | POA: Insufficient documentation

## 2023-08-20 DIAGNOSIS — Z952 Presence of prosthetic heart valve: Secondary | ICD-10-CM | POA: Diagnosis not present

## 2023-08-20 DIAGNOSIS — Z2989 Encounter for other specified prophylactic measures: Secondary | ICD-10-CM | POA: Insufficient documentation

## 2023-08-20 DIAGNOSIS — Z9889 Other specified postprocedural states: Secondary | ICD-10-CM | POA: Diagnosis not present

## 2023-08-20 MED ORDER — AMOXICILLIN 500 MG PO TABS: ORAL_TABLET | ORAL | 3 refills | Status: DC

## 2023-08-20 NOTE — Patient Instructions (Signed)
 Medication Instructions:  - start 81mg  Aspirin  daily  - Start AMOXICILLIN  2G 1 HOUR BEFORE DENTAL WORK   *If you need a refill on your cardiac medications before your next appointment, please call your pharmacy*   Lab Work: NONE    If you have labs (blood work) drawn today and your tests are completely normal, you will receive your results only by: MyChart Message (if you have MyChart) OR A paper copy in the mail If you have any lab test that is abnormal or we need to change your treatment, we will call you to review the results.   Testing/Procedures: Echo will be scheduled at [address].  Your physician has requested that you have an echocardiogram. Echocardiography is a painless test that uses sound waves to create images of your heart. It provides your doctor with information about the size and shape of your heart and how well your heart's chambers and valves are working. This procedure takes approximately one hour. There are no restrictions for this procedure. Please do NOT wear cologne, perfume, aftershave, or lotions (deodorant is allowed). Please arrive 15 minutes prior to your appointment time.    Follow-Up: At [facility], you and your health needs are our priority.  As part of our continuing mission to provide you with exceptional heart care, we have created designated Provider Care Teams.  These Care Teams include your primary Cardiologist (physician) and Advanced Practice Providers (APPs -  Physician Assistants and Nurse Practitioners) who all work together to provide you with the care you need, when you need it.  We recommend signing up for the patient portal called MyChart.  Sign up information is provided on this After Visit Summary.  MyChart is used to connect with patients for Virtual Visits (Telemedicine).  Patients are able to view lab/test results, encounter notes, upcoming appointments, etc.  Non-urgent messages can be sent to your provider as  well.   To learn more about what you can do with MyChart, go to [email or web address].    Your next appointment:   6 month(s)  The format for your next appointment:   In Person  Provider:   Darryle Decent, MD    Other Instructions

## 2023-08-27 ENCOUNTER — Other Ambulatory Visit: Payer: Self-pay | Admitting: Cardiovascular Disease

## 2023-08-27 DIAGNOSIS — Z8679 Personal history of other diseases of the circulatory system: Secondary | ICD-10-CM

## 2023-08-27 DIAGNOSIS — I359 Nonrheumatic aortic valve disorder, unspecified: Secondary | ICD-10-CM

## 2023-08-27 DIAGNOSIS — Z952 Presence of prosthetic heart valve: Secondary | ICD-10-CM

## 2023-08-27 DIAGNOSIS — I150 Renovascular hypertension: Secondary | ICD-10-CM

## 2023-08-27 DIAGNOSIS — Z2989 Encounter for other specified prophylactic measures: Secondary | ICD-10-CM

## 2023-08-27 DIAGNOSIS — I7121 Aneurysm of the ascending aorta, without rupture: Secondary | ICD-10-CM

## 2023-09-09 ENCOUNTER — Telehealth: Payer: Self-pay

## 2023-09-09 NOTE — Telephone Encounter (Signed)
Patient scheduled for monthly B12 as indicated on his last ov note

## 2023-09-09 NOTE — Telephone Encounter (Signed)
Copied from CRM [id number]. Topic: Clinical - Medical Advice >> Sep 09, 2023 11:20 AM Clayton Bibles wrote: Reason for CRM: He wants a call back to schedule his B12 shot with a nurse. I call and spoke with Jon Gills at the office and she said to send this back to have nurse review and schedule.

## 2023-09-10 ENCOUNTER — Ambulatory Visit (INDEPENDENT_AMBULATORY_CARE_PROVIDER_SITE_OTHER): Payer: Medicare Other

## 2023-09-10 DIAGNOSIS — E538 Deficiency of other specified B group vitamins: Secondary | ICD-10-CM

## 2023-09-10 MED ORDER — CYANOCOBALAMIN 1000 MCG/ML IJ SOLN
1000.0000 ug | Freq: Once | INTRAMUSCULAR | Status: AC
  Administered 2023-09-10: 1000 ug via INTRAMUSCULAR

## 2023-09-10 NOTE — Progress Notes (Signed)
Robert Hobbs is a 68 y.o. male presents to the office today for Monthly B12 injection, per physician's orders. Original order: 07/31/23: "Return in about 4 months (around 11/29/2023) for follow up visit, once monthly for b12 injections with nurse. " Cyanocobalamin 1000 mg/ml IM was administered R Deltoid today. Patient tolerated injection. Patient due for follow up labs/provider appt: No.  Patient next injection due: 1 month, appt made Yes  Creft, Melton Alar L

## 2023-09-17 ENCOUNTER — Ambulatory Visit (HOSPITAL_BASED_OUTPATIENT_CLINIC_OR_DEPARTMENT_OTHER): Payer: Medicare Other

## 2023-09-23 ENCOUNTER — Encounter: Payer: Self-pay | Admitting: Cardiovascular Disease

## 2023-09-23 ENCOUNTER — Ambulatory Visit (HOSPITAL_COMMUNITY)
Admission: RE | Admit: 2023-09-23 | Discharge: 2023-09-23 | Disposition: A | Discharge status: Home / Self Care | Payer: Medicare Other | Source: Ambulatory Visit | Attending: Cardiovascular Disease | Admitting: Cardiovascular Disease

## 2023-09-23 DIAGNOSIS — I359 Nonrheumatic aortic valve disorder, unspecified: Secondary | ICD-10-CM

## 2023-09-23 DIAGNOSIS — E785 Hyperlipidemia, unspecified: Secondary | ICD-10-CM | POA: Insufficient documentation

## 2023-09-23 DIAGNOSIS — I7121 Aneurysm of the ascending aorta, without rupture: Secondary | ICD-10-CM

## 2023-09-23 DIAGNOSIS — R55 Syncope and collapse: Secondary | ICD-10-CM | POA: Diagnosis not present

## 2023-09-23 DIAGNOSIS — Z952 Presence of prosthetic heart valve: Secondary | ICD-10-CM | POA: Diagnosis not present

## 2023-09-23 DIAGNOSIS — I10 Essential (primary) hypertension: Secondary | ICD-10-CM | POA: Insufficient documentation

## 2023-09-23 LAB — ECHOCARDIOGRAM COMPLETE
AR max vel: 1.43 cm2
AV Area VTI: 1.56 cm2
AV Area mean vel: 1.41 cm2
AV Mean grad: 3 mm[Hg]
AV Peak grad: 5.6 mm[Hg]
Ao pk vel: 1.18 m/s
Area-P 1/2: 3.27 cm2
Calc EF: 51.8 %
MV VTI: 2.01 cm2
S' Lateral: 2.5 cm
Single Plane A2C EF: 52.1 %
Single Plane A4C EF: 50.9 %

## 2023-09-23 NOTE — Progress Notes (Signed)
  Echocardiogram 2D Echocardiogram has been performed.  Ocie Doyne RDCS 09/23/2023, 11:43 AM

## 2023-10-04 ENCOUNTER — Ambulatory Visit: Payer: Self-pay | Admitting: Cardiology

## 2023-10-07 ENCOUNTER — Other Ambulatory Visit: Payer: Self-pay | Admitting: Family

## 2023-10-07 ENCOUNTER — Other Ambulatory Visit: Payer: Self-pay

## 2023-10-07 MED ORDER — METOPROLOL TARTRATE 25 MG PO TABS: 25.0000 mg | ORAL_TABLET | Freq: Two times a day (BID) | ORAL | 1 refills | Status: DC

## 2023-10-07 NOTE — Telephone Encounter (Unsigned)
Copied from CRM [id number]. Topic: Clinical - Medication Refill >> Oct 07, 2023  2:36 PM Truddie Crumble wrote: Most Recent Primary Care Visit:  Provider: [name]  Department: [facility]  Visit Type: CLINICAL SUPPORT  Date: 09/10/2023  Medication: metoprolol tartrate (patient states that he received the medication from the [facility] and he was prescribed to take two a day and not one a day)  Has the patient contacted their pharmacy? No (Agent: If no, request that the patient contact the pharmacy for the refill. If patient does not wish to contact the pharmacy document the reason why and proceed with request.) (Agent: If yes, when and what did the pharmacy advise?)  Is this the correct pharmacy for this prescription? Yes If no, delete pharmacy and type the correct one.  This is the patient's preferred pharmacy:  [facility] [id number] [place], [place] - [address] [place] [address] Phone: [phone] Fax: [phone]   Has the prescription been filled recently? No  Is the patient out of the medication? Yes  Has the patient been seen for an appointment in the last year OR does the patient have an upcoming appointment? Yes  Can we respond through MyChart? Yes  Agent: Please be advised that Rx refills may take up to 3 business days. We ask that you follow-up with your pharmacy.

## 2023-10-07 NOTE — Telephone Encounter (Signed)
Copied from CRM [id number]. Topic: Clinical - Medication Refill >> Oct 07, 2023  2:36 PM Truddie Crumble wrote: Most Recent Primary Care Visit:  Provider: [name]  Department: [facility]  Visit Type: CLINICAL SUPPORT  Date: 09/10/2023  Medication: metoprolol tartrate (patient states that he received the medication from the [facility] and he was prescribed to take two a day and not one a day)  Has the patient contacted their pharmacy? No (Agent: If no, request that the patient contact the pharmacy for the refill. If patient does not wish to contact the pharmacy document the reason why and proceed with request.) (Agent: If yes, when and what did the pharmacy advise?)  Is this the correct pharmacy for this prescription? Yes If no, delete pharmacy and type the correct one.  This is the patient's preferred pharmacy:  [facility] [id number] [place], [place] - [address] [place] [address] Phone: [phone] Fax: [phone]   Has the prescription been filled recently? No  Is the patient out of the medication? Yes  Has the patient been seen for an appointment in the last year OR does the patient have an upcoming appointment? Yes  Can we respond through MyChart? Yes  Agent: Please be advised that Rx refills may take up to 3 business days. We ask that you follow-up with your pharmacy.

## 2023-10-11 ENCOUNTER — Ambulatory Visit: Payer: Medicare Other

## 2023-10-16 ENCOUNTER — Ambulatory Visit: Payer: Medicare Other

## 2023-10-16 ENCOUNTER — Ambulatory Visit (INDEPENDENT_AMBULATORY_CARE_PROVIDER_SITE_OTHER): Payer: Medicare Other

## 2023-10-16 DIAGNOSIS — E538 Deficiency of other specified B group vitamins: Secondary | ICD-10-CM

## 2023-10-16 MED ORDER — CYANOCOBALAMIN 1000 MCG/ML IJ SOLN
1000.0000 ug | Freq: Once | INTRAMUSCULAR | Status: AC
  Administered 2023-10-16: 1000 ug via INTRAMUSCULAR

## 2023-10-16 NOTE — Progress Notes (Signed)
 Pt here for monthly B12 injection per PCP  B12 given IM L deltoid, and pt tolerated injection well.  Next B12 injection scheduled for 11/07/2023.

## 2023-11-07 ENCOUNTER — Other Ambulatory Visit: Payer: Self-pay | Admitting: Family

## 2023-11-07 ENCOUNTER — Ambulatory Visit (INDEPENDENT_AMBULATORY_CARE_PROVIDER_SITE_OTHER): Payer: Medicare Other

## 2023-11-07 DIAGNOSIS — E538 Deficiency of other specified B group vitamins: Secondary | ICD-10-CM

## 2023-11-07 MED ORDER — CYANOCOBALAMIN 1000 MCG/ML IJ SOLN
1000.0000 ug | Freq: Once | INTRAMUSCULAR | Status: AC
  Administered 2023-11-07: 1000 ug via INTRAMUSCULAR

## 2023-11-07 MED ORDER — METHOCARBAMOL 500 MG PO TABS: 500.0000 mg | ORAL_TABLET | Freq: Three times a day (TID) | ORAL | 0 refills | Status: DC | PRN

## 2023-11-07 NOTE — Progress Notes (Addendum)
 Pt here for monthly B12 injection per PCP  B12 given IM L deltoid, and pt tolerated injection well.  Next B12 injection scheduled for 12/10/2023.

## 2023-12-10 ENCOUNTER — Ambulatory Visit

## 2023-12-10 NOTE — Progress Notes (Deleted)
 Robert Hobbs is a 68 y.o. male presents to the office today for Monthly B12 injections per physician's orders. Original order: 07/31/23: "Return in about 4 months (around 11/29/2023) for follow up visit, once monthly for b12 injections with nurse. "  Cyanocobalamin  1000 mcg/ml IM was administered *** Deltoid today. Patient tolerated injection. Patient due for follow up labs/provider appt: Yes. Date due: Now, appt made {yes/no:20286} Patient next injection due: 1 month, appt made {yes/no:20286}  Creft, Francena Infield L

## 2023-12-11 ENCOUNTER — Ambulatory Visit (INDEPENDENT_AMBULATORY_CARE_PROVIDER_SITE_OTHER)

## 2023-12-11 DIAGNOSIS — E538 Deficiency of other specified B group vitamins: Secondary | ICD-10-CM | POA: Diagnosis not present

## 2023-12-11 MED ORDER — CYANOCOBALAMIN 1000 MCG/ML IJ SOLN
1000.0000 ug | Freq: Once | INTRAMUSCULAR | Status: AC
  Administered 2023-12-11: 1000 ug via INTRAMUSCULAR

## 2023-12-11 NOTE — Progress Notes (Signed)
 Pt here for monthly B12 injection per Melissa  B12 1000mcg given IM, and pt tolerated injection well.  Next B12 injection scheduled for May

## 2024-01-09 ENCOUNTER — Ambulatory Visit (INDEPENDENT_AMBULATORY_CARE_PROVIDER_SITE_OTHER)

## 2024-01-09 DIAGNOSIS — E538 Deficiency of other specified B group vitamins: Secondary | ICD-10-CM | POA: Diagnosis not present

## 2024-01-09 MED ORDER — CYANOCOBALAMIN 1000 MCG/ML IJ SOLN
1000.0000 ug | Freq: Once | INTRAMUSCULAR | Status: AC
  Administered 2024-01-09: 1000 ug via INTRAMUSCULAR

## 2024-01-09 NOTE — Progress Notes (Addendum)
 Pt here for monthly B12 injection per original order dated:  07/31/23: "Return in about 4 months (around 11/29/2023) for follow up visit, once monthly for b12 injections with nurse. "   Last B12 injection:12/11/23  Last B12 level:  435 on 05/20/23  B12 given IM left deltoid, and pt tolerated injection well.  Next B12 injection scheduled for: 02/11/24.

## 2024-01-14 ENCOUNTER — Encounter: Payer: Self-pay | Admitting: Cardiovascular Disease

## 2024-01-14 ENCOUNTER — Telehealth: Payer: Self-pay | Admitting: *Deleted

## 2024-01-14 NOTE — Telephone Encounter (Signed)
 Reason for walk-in: Walk-in Reasons: form/record drop-off (for provider mailbox or FMLA, etc.)  Patient walked into office requesting a letter from Dr. 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 He needs this letter to start working for a new company and for DOT purposes.  Advised patient that Dr. Rolm Clos will be back in the office tomorrow. Will route this message to provider and covering team member.

## 2024-01-15 NOTE — Telephone Encounter (Signed)
 Please review and advise- FYI two separate mychart messages

## 2024-01-16 ENCOUNTER — Telehealth: Payer: Self-pay | Admitting: Cardiovascular Disease

## 2024-01-16 MED ORDER — AMLODIPINE BESYLATE 5 MG PO TABS: 5.0000 mg | ORAL_TABLET | Freq: Every day | ORAL | 3 refills | Status: DC

## 2024-01-16 NOTE — Telephone Encounter (Signed)
 Patient calling in to see if his flma forms were filled out. States that he needs them in order to go back to work. Please advise

## 2024-01-16 NOTE — Addendum Note (Signed)
 Addended by: Alanna Alley on: 01/16/2024 07:56 AM   Modules accepted: Orders

## 2024-01-17 ENCOUNTER — Encounter: Payer: Self-pay | Admitting: Family

## 2024-01-17 ENCOUNTER — Ambulatory Visit (INDEPENDENT_AMBULATORY_CARE_PROVIDER_SITE_OTHER): Admitting: Family

## 2024-01-17 VITALS — BP 135/85 | HR 74 | Temp 97.7°F | Resp 16 | Ht 70.0 in | Wt 173.0 lb

## 2024-01-17 DIAGNOSIS — E782 Mixed hyperlipidemia: Secondary | ICD-10-CM | POA: Diagnosis not present

## 2024-01-17 DIAGNOSIS — I10 Essential (primary) hypertension: Secondary | ICD-10-CM | POA: Diagnosis not present

## 2024-01-17 DIAGNOSIS — Z1211 Encounter for screening for malignant neoplasm of colon: Secondary | ICD-10-CM

## 2024-01-17 DIAGNOSIS — Z8679 Personal history of other diseases of the circulatory system: Secondary | ICD-10-CM

## 2024-01-17 DIAGNOSIS — E038 Other specified hypothyroidism: Secondary | ICD-10-CM

## 2024-01-17 DIAGNOSIS — Z9889 Other specified postprocedural states: Secondary | ICD-10-CM

## 2024-01-17 DIAGNOSIS — E538 Deficiency of other specified B group vitamins: Secondary | ICD-10-CM

## 2024-01-17 MED ORDER — LEVOTHYROXINE SODIUM 75 MCG PO TABS: 75.0000 ug | ORAL_TABLET | Freq: Every day | ORAL | 1 refills | Status: DC

## 2024-01-17 MED ORDER — METOPROLOL SUCCINATE ER 50 MG PO TB24: 50.0000 mg | ORAL_TABLET | Freq: Every day | ORAL | 1 refills | Status: DC

## 2024-01-17 NOTE — Patient Instructions (Signed)
 VISIT SUMMARY:  You visited us  today to discuss your blood pressure management after your aortic valve and aneurysm repair. We also reviewed your current medications and general health maintenance.  YOUR PLAN:  HYPERTENSION: Your blood pressure readings have been variable on your current medication regimen. -Continue taking amlodipine  as prescribed. -Switch from metoprolol  tartrate 25 mg twice daily to metoprolol  XL 50 mg once daily. -Monitor your blood pressure at home and report consistent readings >140/90 to us .  GENERAL HEALTH MAINTENANCE: You have stopped taking your statin medication because you believe your cholesterol is low due to lifestyle changes. -We will order a cholesterol test to check your levels. -Depending on the results, we may discuss restarting your statin medication.  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 -Please schedule a follow-up appointment with your cardiology team to obtain the clearance letter.

## 2024-01-17 NOTE — Telephone Encounter (Signed)
 Pt calling in about this letter. Please advise.

## 2024-01-17 NOTE — Progress Notes (Signed)
 Subjective:     Patient ID: Robert Hobbs, male    DOB: 07-Mar-1956, 68 y.o.   MRN: [id number]  Chief Complaint  Patient presents with   Hypertension    Here for follow up    HPI  Discussed the use of AI scribe software for clinical note transcription with the patient, who gave verbal consent to proceed.  History of Present Illness   Robert Hobbs is a 68 year old male who presents with variable blood pressure management concerns post-aortic valve and ascending aortic aneurysm repair.  He experiences variable blood pressure while on metoprolol  tartrate twice daily. Amlodipine  was recently added to his regimen, which he feels has improved his condition. He prefers a medication regimen that does not require twice-daily dosing.  He continues to receive monthly B12 injections. He has discontinued his statin medication, believing his cholesterol is not high due to lifestyle changes, but is open to restarting it if necessary.   Health Maintenance Due  Topic Date Due   Medicare Annual Wellness (AWV)  Never done   Colonoscopy  Never done   Zoster Vaccines- Shingrix (1 of 2) Never done   COVID-19 Vaccine (1 - 2024-25 season) Never done    Past Medical History:  Diagnosis Date   Aortic stenosis    Hypertension    Labyrinthitis 01/11/2010   Qualifier: Diagnosis of   By: Kingsley Penny MD, Darlene Ehlers     IMO SNOMED Dx Update Oct 2024     Mitral valve prolapse 02/24/2014   Severe aortic stenosis 07/15/2016    Past Surgical History:  Procedure Laterality Date   AORTIC VALVE REPAIR  05/2023   27mm Inspiris Valve, also had ligation of left atrial appendage and right axillary cannulation   ARTHROSCOPIC REPAIR ACL Left 1996   ASCENDING AORTIC ANEURYSM REPAIR  05/2023   [facility]   ORIF HUMERUS FRACTURE Right 1978    Family History  Problem Relation Age of Onset   Anxiety disorder Mother    Arrhythmia Mother        "died in her sleep" ? MI   Alzheimer's disease Father     Obesity Sister    Prostate cancer Brother     Social History   Socioeconomic History   Marital status: Divorced    Spouse name: Not on file   Number of children: 5   Years of education: Not on file   Highest education level: Master's degree (e.g., MA, MS, MEng, MEd, MSW, MBA)  Occupational History   Occupation: Truck Hospital doctor  Tobacco Use   Smoking status: Never   Smokeless tobacco: Never  Substance and Sexual Activity   Alcohol use: No   Drug use: No   Sexual activity: Not on file  Other Topics Concern   Not on file  Social History Narrative   He is separated   He has a 25 year old  (daughter) and 21 yr old (daughter) 68 yr old (daughter) and 52 year (son) 53 year old daughter   Long distance truck driver   Enjoys biking but does not have much time to do that   Social Drivers of Corporate investment banker Strain: Low Risk  (01/16/2024)   Overall Financial Resource Strain (CARDIA)    Difficulty of Paying Living Expenses: Not very hard  Food Insecurity: No Food Insecurity (01/16/2024)   Hunger Vital Sign    Worried About Running Out of Food in the Last Year: Never true  Ran Out of Food in the Last Year: Never true  Transportation Needs: No Transportation Needs (01/16/2024)   PRAPARE - Administrator, Civil Service (Medical): No    Lack of Transportation (Non-Medical): No  Physical Activity: Sufficiently Active (01/16/2024)   Exercise Vital Sign    Days of Exercise per Week: 5 days    Minutes of Exercise per Session: 90 min  Stress: No Stress Concern Present (01/16/2024)   [employer] of Occupational Health - Occupational Stress Questionnaire    Feeling of Stress : Only a little  Social Connections: Unknown (01/16/2024)   Social Connection and Isolation Panel [NHANES]    Frequency of Communication with Friends and Family: Patient declined    Frequency of Social Gatherings with Friends and Family: Patient declined    Attends Religious Services: Patient  declined    Database administrator or Organizations: No    Attends Engineer, structural: Not on file    Marital Status: Divorced  Intimate Partner Violence: Not At Risk (06/04/2023)   Received from [facility]   Humiliation, Afraid, Rape, and Kick questionnaire    Fear of Current or Ex-Partner: No    Emotionally Abused: No    Physically Abused: No    Sexually Abused: No    Outpatient Medications Prior to Visit  Medication Sig Dispense Refill   acetaminophen (TYLENOL) 500 MG tablet Take 500 mg by mouth as needed.     amLODipine  (NORVASC ) 5 MG tablet Take 1 tablet (5 mg total) by mouth daily. 90 tablet 3   loratadine  (CLARITIN ) 10 MG tablet Take 1 tablet (10 mg total) by mouth daily. 90 tablet 3   amoxicillin  (AMOXIL ) 500 MG tablet Prophylaxis for dental procedure. Take 2 grams (4x 500mg  tablets) 1 hour before procedure. 4 tablet 3   atorvastatin  (LIPITOR) 40 MG tablet Take 1 tablet (40 mg total) by mouth daily. 90 tablet 1   levothyroxine  (SYNTHROID ) 75 MCG tablet Take 1 tablet (75 mcg total) by mouth daily. 90 tablet 1   methocarbamol  (ROBAXIN ) 500 MG tablet Take 1 tablet (500 mg total) by mouth every 8 (eight) hours as needed for muscle spasms. 20 tablet 0   metoprolol  tartrate (LOPRESSOR ) 25 MG tablet Take 1 tablet (25 mg total) by mouth 2 (two) times daily. 180 tablet 1   No facility-administered medications prior to visit.    Allergies  Allergen Reactions   Cleocin [Clindamycin Hcl] Nausea And Vomiting   Shellfish Allergy     ROS    See HPI Objective:     Physical Exam Constitutional:      General: He is not in acute distress.    Appearance: He is well-developed.  HENT:     Head: Normocephalic and atraumatic.  Cardiovascular:     Rate and Rhythm: Normal rate and regular rhythm.     Heart sounds: Murmur heard.  Pulmonary:     Effort: Pulmonary effort is normal. No respiratory distress.     Breath sounds: Normal breath sounds. No wheezing or rales.   Skin:    General: Skin is warm and dry.  Neurological:     Mental Status: He is alert and oriented to person, place, and time.  Psychiatric:        Behavior: Behavior normal.        Thought Content: Thought content normal.      BP 135/85 (BP Location: Right Arm, Patient Position: Sitting, Cuff Size: Normal)   Pulse 74   Temp  97.7 F (36.5 C) (Oral)   Resp 16   Ht 5\' 10"  (1.778 m)   Wt 173 lb (78.5 kg)   SpO2 98%   BMI 24.82 kg/m  Wt Readings from Last 3 Encounters:  01/17/24 173 lb (78.5 kg)  08/20/23 178 lb (80.7 kg)  07/31/23 180 lb (81.6 kg)       Assessment & Plan:   Problem List Items Addressed This Visit       Unprioritized   Subclinical hypothyroidism   Lab Results  Component Value Date   TSH 2.38 07/02/2023   Clinically stable on synthroid . Update TSH.       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 - Advise him to schedule follow-up with cardiologist for clearance letter.      Hyperlipidemia    Discontinued statin due to perceived low cholesterol and lifestyle changes. Open to restarting if necessary. - Order cholesterol test. - Discuss potential restart of statin therapy based on cholesterol test results.      Relevant Medications   metoprolol  succinate (TOPROL -XL) 50 MG 24 hr tablet   Other Relevant Orders   Lipid panel   Essential hypertension - Primary    Hypertension with variable readings on metoprolol  tartrate BID. Amlodipine  addition beneficial. Desires once-daily regimen. - Continue amlodipine . - Change metoprolol  tartrate 25 mg BID to metoprolol  XL 50 mg once daily. - Instruct him to monitor blood pressure at home and report back if consistent readings>140/90.       Relevant Medications   metoprolol   succinate (TOPROL -XL) 50 MG 24 hr tablet   Other Relevant Orders   Ambulatory referral to Cardiology   Comp Met (CMET)   B12 deficiency   Continue b12 injections monthly.      Other Visit Diagnoses       Screening for colon cancer       Relevant Orders   Cologuard       I have discontinued Susana Enter A. Plotts's atorvastatin , amoxicillin , metoprolol  tartrate, and methocarbamol . I am also having him start on metoprolol  succinate. Additionally, I am having him maintain his loratadine , acetaminophen, amLODipine , and levothyroxine .  Meds ordered this encounter  Medications   metoprolol  succinate (TOPROL -XL) 50 MG 24 hr tablet    Sig: Take 1 tablet (50 mg total) by mouth daily. Take with or immediately following a meal.    Dispense:  90 tablet    Refill:  1    Supervising Provider:   Randie Bustle A [[id number]]   levothyroxine  (SYNTHROID ) 75 MCG tablet    Sig: Take 1 tablet (75 mcg total) by mouth daily.    Dispense:  90 tablet    Refill:  1    Supervising Provider:   Randie Bustle A [[id number]]

## 2024-01-17 NOTE — Assessment & Plan Note (Signed)
  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 - Advise him to schedule follow-up with cardiologist for clearance letter.

## 2024-01-17 NOTE — Assessment & Plan Note (Signed)
  Hypertension with variable readings on metoprolol  tartrate BID. Amlodipine  addition beneficial. Desires once-daily regimen. - Continue amlodipine . - Change metoprolol  tartrate 25 mg BID to metoprolol  XL 50 mg once daily. - Instruct him to monitor blood pressure at home and report back if consistent readings>140/90.

## 2024-01-17 NOTE — Assessment & Plan Note (Signed)
Continue b12 injections monthly.  

## 2024-01-17 NOTE — Assessment & Plan Note (Signed)
  Discontinued statin due to perceived low cholesterol and lifestyle changes. Open to restarting if necessary. - Order cholesterol test. - Discuss potential restart of statin therapy based on cholesterol test results.

## 2024-01-17 NOTE — Assessment & Plan Note (Addendum)
 Lab Results  Component Value Date   TSH 2.38 07/02/2023   Clinically stable on synthroid . Update TSH.

## 2024-01-18 ENCOUNTER — Encounter: Payer: Self-pay | Admitting: Family

## 2024-01-18 LAB — LIPID PANEL
Cholesterol: 232 mg/dL — ABNORMAL HIGH (ref <200)
HDL: 66 mg/dL (ref >=40)
LDL Cholesterol (Calc): 145 mg/dL — ABNORMAL HIGH
Non-HDL Cholesterol (Calc): 166 mg/dL — ABNORMAL HIGH (ref <130)
Total CHOL/HDL Ratio: 3.5 (calc) (ref <5.0)
Triglycerides: 97 mg/dL (ref <150)

## 2024-01-18 LAB — COMPREHENSIVE METABOLIC PANEL WITH GFR
AG Ratio: 1.5 (calc) (ref 1.0–2.5)
ALT: 11 U/L (ref 9–46)
AST: 23 U/L (ref 10–35)
Albumin: 4.8 g/dL (ref 3.6–5.1)
Alkaline phosphatase (APISO): 64 U/L (ref 35–144)
BUN/Creatinine Ratio: 18 (calc) (ref 6–22)
BUN: 29 mg/dL — ABNORMAL HIGH (ref 7–25)
CO2: 26 mmol/L (ref 20–32)
Calcium: 10.1 mg/dL (ref 8.6–10.3)
Chloride: 102 mmol/L (ref 98–110)
Creat: 1.59 mg/dL — ABNORMAL HIGH (ref 0.70–1.35)
Globulin: 3.2 g/dL (ref 1.9–3.7)
Glucose, Bld: 78 mg/dL (ref 65–99)
Potassium: 4.5 mmol/L (ref 3.5–5.3)
Sodium: 137 mmol/L (ref 135–146)
Total Bilirubin: 0.7 mg/dL (ref 0.2–1.2)
Total Protein: 8 g/dL (ref 6.1–8.1)
eGFR: 47 mL/min/{1.73_m2} — ABNORMAL LOW (ref >=60)

## 2024-01-18 LAB — TSH: TSH: 4.53 m[IU]/L — ABNORMAL HIGH (ref 0.40–4.50)

## 2024-01-19 ENCOUNTER — Ambulatory Visit: Payer: Self-pay | Admitting: Family

## 2024-01-19 ENCOUNTER — Encounter: Payer: Self-pay | Admitting: Family

## 2024-01-20 ENCOUNTER — Other Ambulatory Visit: Payer: Self-pay

## 2024-01-20 MED ORDER — LEVOTHYROXINE SODIUM 88 MCG PO TABS: 88.0000 ug | ORAL_TABLET | Freq: Every day | ORAL | 1 refills | Status: DC

## 2024-01-20 NOTE — Telephone Encounter (Signed)
 See previous encounter. Letter created. Awaiting provider signature.

## 2024-01-20 NOTE — Telephone Encounter (Signed)
Spoke to patient and information provided.

## 2024-01-20 NOTE — Telephone Encounter (Signed)
 Patient identification verified by 2 forms. Sims Duck, RN     Called and spoke to patient  Patient states:  - He did not drop any forms off.  - Our office provided a clearance letter back in 07/2023 but now DOT is requesting more information.  - He does not have a letter to send us  showing requested information by DOT. He states he saw on the DOT website that a stress test and testing showing his ejection fraction is required.   Patient denies:  - Abnormal symptoms at time  of call.              Interventions/Plan: - Informed patient Dr. Rolm Clos has cleared him and okay'd a letter being provided to him. Dr. Rolm Clos has not requested a stress test be ordered following any recent test or lab results for the patient.  Any additional testing would have to be reviewed and ordered during an office evaluation.  - This RN will provide clearance letter as stated by Dr. Rolm Clos and call patient once it is signed and ready for pick up.    Patient agrees with plan, no further questions at this time

## 2024-01-24 NOTE — Telephone Encounter (Signed)
 I called the pt to let him know that his letter has been prepared but Dr Edsel Grace nurse is waiting for the MD to sign it.. he was frustrated and stressed that he needs it as soon as he can get it.. I will send to Dr Deanna Expose' Neal's nurse for review.

## 2024-01-24 NOTE — Telephone Encounter (Signed)
 Patient called to follow-up on letter stating he is OK to drive.  Patient has visit scheduled on 7/22 with A. Duke.

## 2024-01-27 ENCOUNTER — Telehealth: Payer: Self-pay | Admitting: Family

## 2024-01-27 NOTE — Telephone Encounter (Signed)
 Patient identification verified by 2 forms. Sims Duck, RN   Patient presented to office today to pick up letter.   Letter provided to patient. Patient voiced concerns regarding whether the letter would meet DOT requirements.  He was unsure at this time which requirements need to be met.  Reassured patient letter language provides medical clearance from Dr.O'neal for patient to return to work without restrictions.  Instructed patient to call me back directly if this letter does not suffice.    Patient agrees with plan, no questions at this time

## 2024-01-27 NOTE — Telephone Encounter (Signed)
 Copied from CRM [id number]. Topic: Medicare AWV >> Jan 27, 2024  1:07 PM Juliana Ocean wrote: Reason for CRM: LVM 01/27/2024 to schedule AWV. Please schedule Virtual or Telehealth visits ONLY.   Rosalee Collins; Care Guide Ambulatory Clinical Support [facility] Direct Dial: [phone]

## 2024-02-05 ENCOUNTER — Ambulatory Visit (INDEPENDENT_AMBULATORY_CARE_PROVIDER_SITE_OTHER)

## 2024-02-05 DIAGNOSIS — E538 Deficiency of other specified B group vitamins: Secondary | ICD-10-CM

## 2024-02-05 MED ORDER — CYANOCOBALAMIN 1000 MCG/ML IJ SOLN
1000.0000 ug | Freq: Once | INTRAMUSCULAR | Status: AC
  Administered 2024-02-05: 1000 ug via INTRAMUSCULAR

## 2024-02-05 NOTE — Progress Notes (Signed)
 Pt here for monthly B12 injection per PCP Last B12 injection:  Last B12 level: 435 on 05/20/2023  B12 1000mcg given IM L deltoid, and pt tolerated injection well.  Next B12 injection scheduled for: 03/05/2024

## 2024-02-11 ENCOUNTER — Ambulatory Visit

## 2024-02-17 ENCOUNTER — Ambulatory Visit: Admitting: Cardiology

## 2024-03-01 NOTE — Progress Notes (Deleted)
 Cardiology Office Note:    Date:  03/01/2024   ID:  Robert Hobbs, DOB 1956/03/18, MRN [id number]  PCP:  Daryl Setter, NP   [facility] Providers Cardiologist:  Darryle ONEIDA Decent, MD { Click to update primary MD,subspecialty MD or APP then REFRESH:1}    Referring MD: Daryl Setter, NP   No chief complaint on file. ***  History of Present Illness:    Robert Hobbs is a 68 y.o. male with a hx of ascending aortic aneurysm repair and AVR at [facility] in [facility] 06/13/23, nonspecific IVCD with LVH, low normal LVEF 50-55%, HTN, and HLD,   Last echocardiogram 09/2023 with good AVR function.   He requires cardiac clearance for CDL.   He presents for follow up.      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     Past Medical History:  Diagnosis Date   Aortic stenosis    Hypertension    Labyrinthitis 01/11/2010   Qualifier: Diagnosis of   By: Viviann Raddle MD, Marsha SAUNDERS     IMO SNOMED Dx Update Oct 2024     Mitral valve prolapse 02/24/2014   Severe aortic stenosis 07/15/2016    Past Surgical History:  Procedure Laterality Date   AORTIC VALVE REPAIR  05/2023   27mm Inspiris Valve, also had ligation of left atrial appendage and right axillary cannulation   ARTHROSCOPIC REPAIR ACL Left 1996   ASCENDING AORTIC ANEURYSM REPAIR  05/2023   [facility]   ORIF HUMERUS FRACTURE Right 1978    Current Medications: No outpatient medications have been marked as taking for the 03/10/24 encounter (Appointment) with Madie Jon Garre,  PA.     Allergies:   Cleocin [clindamycin hcl] and Shellfish allergy   Social History   Socioeconomic History   Marital status: Divorced    Spouse name: Not on file   Number of children: 5   Years of education: Not on file   Highest education level: Master's degree (e.g., MA, MS, MEng, MEd, MSW, MBA)  Occupational History   Occupation: Truck Hospital doctor  Tobacco Use   Smoking status: Never   Smokeless tobacco: Never  Substance and Sexual Activity   Alcohol use: No   Drug use: No   Sexual activity: Not on file  Other Topics Concern   Not on file  Social History Narrative   He is separated   He has a 45 year old  (daughter) and 66 yr old (daughter) 10 yr old (daughter) and 65 year (son) 57 year old daughter   Long distance truck driver   Enjoys biking but does not have much time to do that   Social Drivers of Corporate investment banker Strain: Low Risk  (01/16/2024)   Overall Financial Resource Strain (CARDIA)    Difficulty of Paying Living Expenses: Not very hard  Food Insecurity: No Food Insecurity (01/16/2024)   Hunger Vital Sign    Worried About Running Out of Food in the Last Year: Never true    Ran Out  of Food in the Last Year: Never true  Transportation Needs: No Transportation Needs (01/16/2024)   PRAPARE - Administrator, Civil Service (Medical): No    Lack of Transportation (Non-Medical): No  Physical Activity: Sufficiently Active (01/16/2024)   Exercise Vital Sign    Days of Exercise per Week: 5 days    Minutes of Exercise per Session: 90 min  Stress: No Stress Concern Present (01/16/2024)   [employer] of Occupational Health - Occupational Stress Questionnaire    Feeling of Stress : Only a little  Social Connections: Unknown (01/16/2024)   Social Connection and Isolation Panel    Frequency of Communication with Friends and Family: Patient declined    Frequency of Social Gatherings with Friends and Family: Patient declined    Attends Religious  Services: Patient declined    Database administrator or Organizations: No    Attends Engineer, structural: Not on file    Marital Status: Divorced     Family History: The patient's ***family history includes Alzheimer's disease in his father; Anxiety disorder in his mother; Arrhythmia in his mother; Obesity in his sister; Prostate cancer in his brother.  ROS:   Please see the history of present illness.    *** All other systems reviewed and are negative.  EKGs/Labs/Other Studies Reviewed:    The following studies were reviewed today:  Echo 09/2023:  1. Abnormal septal motion and inferior basal hypokinesis . Left  ventricular ejection fraction, by estimation, is 50 to 55%. The left  ventricle has low normal function. The left ventricle has no regional wall  motion abnormalities. The left ventricular  internal cavity size was mildly dilated. There is mild left ventricular  hypertrophy. Left ventricular diastolic parameters are consistent with  Grade I diastolic dysfunction (impaired relaxation).   2. Right ventricular systolic function is normal. The right ventricular  size is normal.   3. Left atrial size was mildly dilated.   4. Right atrial size was mildly dilated.   5. The mitral valve is abnormal. Trivial mitral valve regurgitation. No  evidence of mitral stenosis.   6. Post 27 mm Inspiris Resilia bioproshtetic AVR with root/arch repair  Mean gradient 3 peak 6 mmHg no significant AR or PVL . The aortic valve  has been repaired/replaced. Aortic valve regurgitation is not visualized.  No aortic stenosis is present.   7. The inferior vena cava is normal in size with greater than 50%  respiratory variability, suggesting right atrial pressure of 3 mmHg.       Recent Labs: 03/06/2023: Magnesium 2.2 07/31/2023: Hemoglobin 13.8; Platelets 204.0 01/17/2024: ALT 11; BUN 29; Creat 1.59; Potassium 4.5; Sodium 137; TSH 4.53  Recent Lipid Panel    Component Value Date/Time    CHOL 232 (H) 01/17/2024 1613   TRIG 97 01/17/2024 1613   HDL 66 01/17/2024 1613   CHOLHDL 3.5 01/17/2024 1613   VLDL 18.0 07/02/2023 1639   LDLCALC 145 (H) 01/17/2024 1613     Risk Assessment/Calculations:   {Does this patient have ATRIAL FIBRILLATION?:[phone]}  No BP recorded.  {Refresh Note OR Click here to enter BP  :1}***         Physical Exam:    VS:  There were no vitals taken for this visit.    Wt Readings from Last 3 Encounters:  01/17/24 173 lb (78.5 kg)  08/20/23 178 lb (80.7 kg)  07/31/23 180 lb (81.6 kg)     GEN: *** Well nourished, well developed in  no acute distress HEENT: Normal NECK: No JVD; No carotid bruits LYMPHATICS: No lymphadenopathy CARDIAC: ***RRR, no murmurs, rubs, gallops RESPIRATORY:  Clear to auscultation without rales, wheezing or rhonchi  ABDOMEN: Soft, non-tender, non-distended MUSCULOSKELETAL:  No edema; No deformity  SKIN: Warm and dry NEUROLOGIC:  Alert and oriented x 3 PSYCHIATRIC:  Normal affect   ASSESSMENT:    No diagnosis found. PLAN:    In order of problems listed above:  ***      {Are you ordering a CV Procedure (e.g. stress test, cath, DCCV, TEE, etc)?   Press F2        :[id number]}    Medication Adjustments/Labs and Tests Ordered: Current medicines are reviewed at length with the patient today.  Concerns regarding medicines are outlined above.  No orders of the defined types were placed in this encounter.  No orders of the defined types were placed in this encounter.   There are no Patient Instructions on file for this visit.   Signed, Jon Nat Hails, [place]  03/01/2024 4:26 PM    [facility]

## 2024-03-05 ENCOUNTER — Ambulatory Visit

## 2024-03-05 ENCOUNTER — Other Ambulatory Visit

## 2024-03-10 ENCOUNTER — Ambulatory Visit: Admitting: Physician Assistant

## 2024-04-27 ENCOUNTER — Other Ambulatory Visit: Payer: Self-pay | Admitting: Family

## 2024-04-28 ENCOUNTER — Other Ambulatory Visit

## 2024-04-29 ENCOUNTER — Other Ambulatory Visit: Payer: Self-pay | Admitting: *Deleted

## 2024-06-22 ENCOUNTER — Telehealth: Payer: Self-pay | Admitting: Family

## 2024-06-22 DIAGNOSIS — E782 Mixed hyperlipidemia: Secondary | ICD-10-CM

## 2024-06-22 DIAGNOSIS — E538 Deficiency of other specified B group vitamins: Secondary | ICD-10-CM

## 2024-06-22 DIAGNOSIS — I10 Essential (primary) hypertension: Secondary | ICD-10-CM

## 2024-06-22 DIAGNOSIS — Z125 Encounter for screening for malignant neoplasm of prostate: Secondary | ICD-10-CM

## 2024-06-22 DIAGNOSIS — E038 Other specified hypothyroidism: Secondary | ICD-10-CM

## 2024-06-22 NOTE — Telephone Encounter (Signed)
 Pt is scheduled for an appt on 07/15/24 for a f/u visit. He wants to get labs done the day before so he can discuss the results with MO. He says he was supposed to have labs recently but he wasn't able to get them at that time. I don't see any orders for labs anywhere. Is it ok to sched for labs on 11/25? Pt wants to be contacted through mychart if its ok

## 2024-06-23 NOTE — Telephone Encounter (Signed)
**Note De-identified  [place] Obfuscation** Please advise 

## 2024-07-14 ENCOUNTER — Ambulatory Visit (INDEPENDENT_AMBULATORY_CARE_PROVIDER_SITE_OTHER)

## 2024-07-14 ENCOUNTER — Other Ambulatory Visit

## 2024-07-14 DIAGNOSIS — Z125 Encounter for screening for malignant neoplasm of prostate: Secondary | ICD-10-CM | POA: Diagnosis not present

## 2024-07-14 DIAGNOSIS — E538 Deficiency of other specified B group vitamins: Secondary | ICD-10-CM | POA: Diagnosis not present

## 2024-07-14 DIAGNOSIS — I10 Essential (primary) hypertension: Secondary | ICD-10-CM

## 2024-07-14 DIAGNOSIS — E038 Other specified hypothyroidism: Secondary | ICD-10-CM | POA: Diagnosis not present

## 2024-07-14 DIAGNOSIS — E782 Mixed hyperlipidemia: Secondary | ICD-10-CM

## 2024-07-14 LAB — COMPREHENSIVE METABOLIC PANEL WITH GFR
ALT: 13 U/L (ref 0–53)
AST: 19 U/L (ref 0–37)
Albumin: 4.5 g/dL (ref 3.5–5.2)
Alkaline Phosphatase: 63 U/L (ref 39–117)
BUN: 17 mg/dL (ref 6–23)
CO2: 33 meq/L — ABNORMAL HIGH (ref 19–32)
Calcium: 9.7 mg/dL (ref 8.4–10.5)
Chloride: 103 meq/L (ref 96–112)
Creatinine, Ser: 1.25 mg/dL (ref 0.40–1.50)
GFR: 58.81 mL/min — ABNORMAL LOW (ref >60.00)
Glucose, Bld: 87 mg/dL (ref 70–99)
Potassium: 4.3 meq/L (ref 3.5–5.1)
Sodium: 140 meq/L (ref 135–145)
Total Bilirubin: 0.7 mg/dL (ref 0.2–1.2)
Total Protein: 7.2 g/dL (ref 6.0–8.3)

## 2024-07-14 LAB — TSH: TSH: 6.64 u[IU]/mL — ABNORMAL HIGH (ref 0.35–5.50)

## 2024-07-14 LAB — VITAMIN B12: Vitamin B-12: 962 pg/mL — ABNORMAL HIGH (ref 211–911)

## 2024-07-14 LAB — PSA, MEDICARE: PSA: 1.48 ng/mL (ref 0.10–4.00)

## 2024-07-14 MED ORDER — CYANOCOBALAMIN 1000 MCG/ML IJ SOLN
1000.0000 ug | Freq: Once | INTRAMUSCULAR | Status: AC
  Administered 2024-07-14: 1000 ug via INTRAMUSCULAR

## 2024-07-14 NOTE — Progress Notes (Signed)
 Pt here for monthly B12 injection per original order dated:   Last B12 injection:02/05/2024  Last B12 level:07/14/24    B12 1000mcg given IM, left deltoid and pt tolerated injection well.  Next B12 injection scheduled for: 08/14/2024 @ 9:00 AM

## 2024-07-15 ENCOUNTER — Ambulatory Visit: Admitting: Family

## 2024-07-15 VITALS — BP 127/78 | HR 64 | Temp 97.6°F | Resp 16 | Ht 70.0 in | Wt 184.0 lb

## 2024-07-15 DIAGNOSIS — I10 Essential (primary) hypertension: Secondary | ICD-10-CM | POA: Diagnosis not present

## 2024-07-15 DIAGNOSIS — E038 Other specified hypothyroidism: Secondary | ICD-10-CM | POA: Diagnosis not present

## 2024-07-15 DIAGNOSIS — E538 Deficiency of other specified B group vitamins: Secondary | ICD-10-CM

## 2024-07-15 DIAGNOSIS — Z23 Encounter for immunization: Secondary | ICD-10-CM | POA: Diagnosis not present

## 2024-07-15 DIAGNOSIS — E782 Mixed hyperlipidemia: Secondary | ICD-10-CM | POA: Diagnosis not present

## 2024-07-15 LAB — LIPID PANEL
Cholesterol: 149 mg/dL (ref <200)
HDL: 59 mg/dL (ref >=40)
LDL Cholesterol (Calc): 64 mg/dL
Non-HDL Cholesterol (Calc): 90 mg/dL (ref <130)
Total CHOL/HDL Ratio: 2.5 (calc) (ref <5.0)
Triglycerides: 189 mg/dL — ABNORMAL HIGH (ref <150)

## 2024-07-15 MED ORDER — METOPROLOL SUCCINATE ER 50 MG PO TB24
50.0000 mg | ORAL_TABLET | Freq: Every day | ORAL | 1 refills | Status: AC
Start: 2024-07-15 — End: ?

## 2024-07-15 MED ORDER — AMLODIPINE BESYLATE 5 MG PO TABS
5.0000 mg | ORAL_TABLET | Freq: Every day | ORAL | 1 refills | Status: AC
Start: 2024-07-15 — End: ?

## 2024-07-15 MED ORDER — LEVOTHYROXINE SODIUM 100 MCG PO TABS: 100.0000 ug | ORAL_TABLET | Freq: Every day | ORAL | 1 refills | Status: AC

## 2024-07-15 MED ORDER — ATORVASTATIN CALCIUM 40 MG PO TABS: 40.0000 mg | ORAL_TABLET | Freq: Every day | ORAL | 1 refills | Status: AC

## 2024-07-15 NOTE — Patient Instructions (Signed)
  VISIT SUMMARY: Today, we reviewed your cholesterol levels, thyroid  medication, and overall health. We discussed your recent blood work, dietary habits, and family medical history. We also addressed your blood pressure management and vitamin B12 levels.  YOUR PLAN: -MIXED HYPERLIPIDEMIA: Mixed hyperlipidemia means you have high levels of different types of fats in your blood. Your triglycerides are slightly elevated due to your diet. We recommend reducing your sugar and carbohydrate intake and focusing on lean proteins and vegetables. We will recheck your triglyceride levels at your next visit.  -ESSENTIAL HYPERTENSION: Essential hypertension is high blood pressure with no identifiable cause. Your blood pressure is well-controlled with your current medications, amlodipine  and metoprolol . Please continue taking these medications as prescribed.  -OTHER SPECIFIED HYPOTHYROIDISM: Hypothyroidism is when your thyroid  gland does not produce enough thyroid  hormone. Your current dose of levothyroxine  is insufficient, so we have increased it to 100 mcg daily. We will recheck your thyroid  function tests in 6 weeks to ensure the new dose is effective.  -OTHER SPECIFIED VITAMIN B12 DEFICIENCY: Vitamin B12 deficiency means you have low levels of vitamin B12, which is important for nerve function and blood cell production. Your B12 levels have improved with supplementation, and although they are slightly high now, it is not a concern. Please continue with your monthly B12 injections.  -GENERAL HEALTH MAINTENANCE: For your general health, we administered your flu shot today. It is important to stay up-to-date with vaccinations to protect against seasonal flu.  INSTRUCTIONS: We will recheck your triglyceride levels and thyroid  function tests at your next visit in 6 weeks. Please continue taking your medications as prescribed and follow the dietary recommendations discussed. If you have any concerns or experience any  new symptoms, please contact our office.

## 2024-07-15 NOTE — Progress Notes (Signed)
 Subjective:     Patient ID: Robert Hobbs, male    DOB: 06-12-1956, 68 y.o.   MRN: [id number]  Chief Complaint  Patient presents with   Hypertension    Here for follow  up   Hypothyroidism    Here for follow up   Back Pain    Patient complains of low back pain flare up    Hypertension  Back Pain    Discussed the use of AI scribe software for clinical note transcription with the patient, who gave verbal consent to proceed.  History of Present Illness Robert Hobbs is a 68 year old male who presents for follow-up on his cholesterol levels and thyroid  medication.  He has noted elevated triglycerides in recent blood work, which he attributes to increased candy consumption and alcohol intake during his time off from work. He mentions drinking smoothies and using Pedialyte for hydration. He does not consume sugary drinks but eats a lot of candy.  He has a history of hyperlipidemia with total cholesterol improving from 232 to 149 and LDL decreasing from 145 to 64. He is working on dietary adjustments to maintain these levels.  He is currently taking Synthroid  88 micrograms daily for hypothyroidism. Despite regular exercise, he continues to take his thyroid  medication as prescribed. He has a family history of thyroid  issues, with his half-brother also affected.  He is on amlodipine  and metoprolol  for blood pressure management and notes that his blood pressure remains stable as long as he takes his medication consistently. He has developed a routine to ensure he does not miss doses.  He has been receiving B12 shots and reports a significant improvement in his levels from 128 to 900. He initially took supplements to address the deficiency and plans to continue with the B12 shots. He experiences increased saliva production when his B12 levels are low, a symptom he shares with his father.  He mentions a family history of Alzheimer's disease, with his father having had a rapid  progression of the condition. His mother was sedentary and had various health issues, which he attributes to her lifestyle.  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 He has been home for about a week and has been indulging in candy and alcohol during this time off.      Health Maintenance Due  Topic Date Due   Medicare Annual Wellness (AWV)  Never done   Zoster Vaccines- Shingrix (1 of 2) Never done   COVID-19 Vaccine (1 - 2025-26 season) Never done    Past Medical History:  Diagnosis Date   Aortic stenosis    Hypertension    Labyrinthitis 01/11/2010   Qualifier: Diagnosis of   By: Viviann Raddle MD, Marsha SAUNDERS     IMO SNOMED Dx Update Oct 2024     Mitral valve prolapse 02/24/2014   Severe aortic stenosis 07/15/2016    Past Surgical History:  Procedure Laterality Date   AORTIC VALVE REPAIR  05/2023   27mm Inspiris Valve, also had ligation of left atrial appendage and right axillary cannulation   ARTHROSCOPIC REPAIR ACL Left 1996   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 MI   Alzheimer's disease Father  Obesity Sister    Prostate cancer Brother     Social History   Socioeconomic History   Marital status: Divorced    Spouse name: Not on file   Number of children: 5   Years of education: Not on file   Highest education level: Master's degree (e.g., MA, MS, MEng, MEd, MSW, MBA)  Occupational History   Occupation: Truck Hospital Doctor  Tobacco Use   Smoking status: Never   Smokeless tobacco: Never  Substance and Sexual Activity   Alcohol use: No   Drug use: No   Sexual activity: Not on file  Other Topics Concern   Not on file  Social History Narrative   He is separated   He has a 11 year old  (daughter) and 75 yr old (daughter) 61 yr old  (daughter) and 26 year (son) 49 year old daughter   Long distance truck driver   Enjoys biking but does not have much time to do that   Social Drivers of Corporate Investment Banker Strain: Low Risk  (07/13/2024)   Overall Financial Resource Strain (CARDIA)    Difficulty of Paying Living Expenses: Not hard at all  Food Insecurity: No Food Insecurity (07/13/2024)   Hunger Vital Sign    Worried About Running Out of Food in the Last Year: Never true    Ran Out of Food in the Last Year: Never true  Transportation Needs: No Transportation Needs (07/13/2024)   PRAPARE - Administrator, Civil Service (Medical): No    Lack of Transportation (Non-Medical): No  Physical Activity: Sufficiently Active (01/16/2024)   Exercise Vital Sign    Days of Exercise per Week: 5 days    Minutes of Exercise per Session: 90 min  Stress: No Stress Concern Present (01/16/2024)   [employer] of Occupational Health - Occupational Stress Questionnaire    Feeling of Stress : Only a little  Social Connections: Socially Isolated (07/13/2024)   Social Connection and Isolation Panel    Frequency of Communication with Friends and Family: Once a week    Frequency of Social Gatherings with Friends and Family: Once a week    Attends Religious Services: Patient declined    Database Administrator or Organizations: No    Attends Engineer, Structural: Not on file    Marital Status: Divorced  Intimate Partner Violence: Not At Risk (06/04/2023)   Received from [facility]   Humiliation, Afraid, Rape, and Kick questionnaire    Within the last year, have you been afraid of your partner or ex-partner?: No    Within the last year, have you been humiliated or emotionally abused in other ways by your partner or ex-partner?: No    Within the last year, have you been kicked, hit, slapped, or otherwise physically hurt by your partner or ex-partner?: No    Within the last year, have you been raped or forced to  have any kind of sexual activity by your partner or ex-partner?: No    Outpatient Medications Prior to Visit  Medication Sig Dispense Refill   acetaminophen (TYLENOL) 500 MG tablet Take 500 mg by mouth as needed.     loratadine  (CLARITIN ) 10 MG tablet Take 1 tablet (10 mg total) by mouth daily. 90 tablet 3   amLODipine  (NORVASC ) 5 MG tablet Take 1 tablet (5 mg total) by mouth daily. 90 tablet 3   atorvastatin  (LIPITOR) 40 MG tablet TAKE 1 TABLET BY MOUTH DAILY 90 tablet 1  levothyroxine  (SYNTHROID ) 75 MCG tablet Take 1 tablet (75 mcg total) by mouth daily. 90 tablet 1   levothyroxine  (SYNTHROID ) 88 MCG tablet Take 1 tablet (88 mcg total) by mouth daily. 30 tablet 1   metoprolol  succinate (TOPROL -XL) 50 MG 24 hr tablet Take 1 tablet (50 mg total) by mouth daily. Take with or immediately following a meal. 90 tablet 1   No facility-administered medications prior to visit.    Allergies  Allergen Reactions   Cleocin [Clindamycin Hcl] Nausea And Vomiting   Shellfish Allergy     Review of Systems  Musculoskeletal:  Positive for back pain.       Objective:    Physical Exam Constitutional:      General: He is not in acute distress.    Appearance: He is well-developed.  HENT:     Head: Normocephalic and atraumatic.  Cardiovascular:     Rate and Rhythm: Normal rate and regular rhythm.     Heart sounds: No murmur heard. Pulmonary:     Effort: Pulmonary effort is normal. No respiratory distress.     Breath sounds: Normal breath sounds. No wheezing or rales.  Musculoskeletal:        General: No swelling.  Skin:    General: Skin is warm and dry.  Neurological:     Mental Status: He is alert and oriented to person, place, and time.  Psychiatric:        Behavior: Behavior normal.        Thought Content: Thought content normal.      BP 127/78 (BP Location: Right Arm, Patient Position: Sitting, Cuff Size: Normal)   Pulse 64   Temp 97.6 F (36.4 C) (Oral)   Resp 16   Ht 5' 10  (1.778 m)   Wt 184 lb (83.5 kg)   SpO2 100%   BMI 26.40 kg/m  Wt Readings from Last 3 Encounters:  07/15/24 184 lb (83.5 kg)  01/17/24 173 lb (78.5 kg)  08/20/23 178 lb (80.7 kg)       Assessment & Plan:   Problem List Items Addressed This Visit       Unprioritized   Subclinical hypothyroidism - Primary   Lab Results  Component Value Date   TSH 6.64 (H) 07/14/2024    Current levothyroxine  dose insufficient. Possible change in thyroid  hormone production. - Increased levothyroxine  dose to 100 mcg daily. - Will recheck thyroid  function tests in 6 weeks.      Relevant Medications   levothyroxine  (SYNTHROID ) 100 MCG tablet   metoprolol  succinate (TOPROL -XL) 50 MG 24 hr tablet   Other Relevant Orders   TSH   Hyperlipidemia   Lab Results  Component Value Date   CHOL 149 07/14/2024   HDL 59 07/14/2024   LDLCALC 64 07/14/2024   TRIG 189 (H) 07/14/2024   CHOLHDL 2.5 07/14/2024   Lipids much improved. Discussed dietary changes for lowering trigs. Continue statin.       Relevant Medications   amLODipine  (NORVASC ) 5 MG tablet   atorvastatin  (LIPITOR) 40 MG tablet   metoprolol  succinate (TOPROL -XL) 50 MG 24 hr tablet   Essential hypertension   BP Readings from Last 3 Encounters:  07/15/24 127/78  01/17/24 135/85  08/20/23 128/80   BP stable on amlodipine , toprol  xl.        Relevant Medications   amLODipine  (NORVASC ) 5 MG tablet   atorvastatin  (LIPITOR) 40 MG tablet   metoprolol  succinate (TOPROL -XL) 50 MG 24 hr tablet   B12 deficiency  B12 levels improved with supplementation. Current levels slightly high but not concerning. - Continue monthly B12 injections.       Other Visit Diagnoses       Needs flu shot       Relevant Orders   Flu vaccine HIGH DOSE PF(Fluzone Trivalent) (Completed)      Assessment & Plan    I have discontinued Reyes A. Cielo's levothyroxine  and levothyroxine . I have also changed his atorvastatin . Additionally, I am having  him start on levothyroxine . Lastly, I am having him maintain his loratadine , acetaminophen, amLODipine , and metoprolol  succinate.  Meds ordered this encounter  Medications   levothyroxine  (SYNTHROID ) 100 MCG tablet    Sig: Take 1 tablet (100 mcg total) by mouth daily.    Dispense:  90 tablet    Refill:  1    Supervising Provider:   DOMENICA BLACKBIRD A [[id number]]   amLODipine  (NORVASC ) 5 MG tablet    Sig: Take 1 tablet (5 mg total) by mouth daily.    Dispense:  90 tablet    Refill:  1    Supervising Provider:   DOMENICA BLACKBIRD A [[id number]]   atorvastatin  (LIPITOR) 40 MG tablet    Sig: Take 1 tablet (40 mg total) by mouth daily.    Dispense:  90 tablet    Refill:  1    Supervising Provider:   DOMENICA BLACKBIRD A [[id number]]   metoprolol  succinate (TOPROL -XL) 50 MG 24 hr tablet    Sig: Take 1 tablet (50 mg total) by mouth daily. Take with or immediately following a meal.    Dispense:  90 tablet    Refill:  1    Supervising Provider:   DOMENICA BLACKBIRD A [[id number]]

## 2024-07-15 NOTE — Assessment & Plan Note (Signed)
  B12 levels improved with supplementation. Current levels slightly high but not concerning. - Continue monthly B12 injections.

## 2024-07-15 NOTE — Assessment & Plan Note (Addendum)
 BP Readings from Last 3 Encounters:  07/15/24 127/78  01/17/24 135/85  08/20/23 128/80   BP stable on amlodipine , toprol  xl.

## 2024-07-15 NOTE — Assessment & Plan Note (Addendum)
 Lab Results  Component Value Date   CHOL 149 07/14/2024   HDL 59 07/14/2024   LDLCALC 64 07/14/2024   TRIG 189 (H) 07/14/2024   CHOLHDL 2.5 07/14/2024   Lipids much improved. Discussed dietary changes for lowering trigs. Continue statin.

## 2024-07-15 NOTE — Assessment & Plan Note (Addendum)
 Lab Results  Component Value Date   TSH 6.64 (H) 07/14/2024    Current levothyroxine  dose insufficient. Possible change in thyroid  hormone production. - Increased levothyroxine  dose to 100 mcg daily. - Will recheck thyroid  function tests in 6 weeks.

## 2024-08-14 ENCOUNTER — Ambulatory Visit

## 2024-08-14 DIAGNOSIS — E538 Deficiency of other specified B group vitamins: Secondary | ICD-10-CM

## 2024-08-14 MED ORDER — CYANOCOBALAMIN 1000 MCG/ML IJ SOLN
1000.0000 ug | Freq: Once | INTRAMUSCULAR | Status: AC
  Administered 2024-08-14: 1000 ug via INTRAMUSCULAR

## 2024-08-14 NOTE — Progress Notes (Signed)
 Pt here for monthly B12 injection per Daryl Setter, NP    B12 1000mcg given IM left deltoid and pt tolerated injection well.  Next B12 injection scheduled for 09/10/24

## 2024-08-18 ENCOUNTER — Ambulatory Visit

## 2024-08-28 ENCOUNTER — Ambulatory Visit

## 2024-08-31 ENCOUNTER — Ambulatory Visit

## 2024-08-31 DIAGNOSIS — E538 Deficiency of other specified B group vitamins: Secondary | ICD-10-CM | POA: Diagnosis not present

## 2024-08-31 MED ORDER — CYANOCOBALAMIN 1000 MCG/ML IJ SOLN
1000.0000 ug | Freq: Once | INTRAMUSCULAR | Status: AC
  Administered 2024-08-31: 1000 ug via INTRAMUSCULAR

## 2024-08-31 NOTE — Progress Notes (Signed)
 Patient is here for monthly B12 injection per original order dated: 07/31/23: Return in about 4 months (around 11/29/2023) for follow up visit, once monthly for b12 injections with nurse.  -Per Robert AILENE Archer, NP  Last B12 injection: 08/14/2024  Last B12 level:  07/14/2024 962  B12 given in Left Deltoid IM, and patient tolerated injection well.  Next B12 injection scheduled for:  1 Month 10/06/2024 @ 9:45am

## 2024-09-10 ENCOUNTER — Ambulatory Visit

## 2024-10-06 ENCOUNTER — Ambulatory Visit
